# Patient Record
Sex: Male | Born: 1950 | Race: White | Hispanic: No | Marital: Married | Smoking: Never smoker
Health system: Southern US, Community
[De-identification: ages and names within clinical notes are randomized; demographics above are authoritative.]

## PROBLEM LIST (undated history)

## (undated) DIAGNOSIS — T451X5A Adverse effect of antineoplastic and immunosuppressive drugs, initial encounter: Secondary | ICD-10-CM

## (undated) DIAGNOSIS — Z8546 Personal history of malignant neoplasm of prostate: Principal | ICD-10-CM

## (undated) DIAGNOSIS — G620 Drug-induced polyneuropathy: Secondary | ICD-10-CM

## (undated) DIAGNOSIS — Z85528 Personal history of other malignant neoplasm of kidney: Secondary | ICD-10-CM

## (undated) DIAGNOSIS — Z974 Presence of external hearing-aid: Secondary | ICD-10-CM

## (undated) DIAGNOSIS — Z8719 Personal history of other diseases of the digestive system: Secondary | ICD-10-CM

## (undated) DIAGNOSIS — Z860100 Personal history of colon polyps, unspecified: Secondary | ICD-10-CM

## (undated) DIAGNOSIS — E785 Hyperlipidemia, unspecified: Secondary | ICD-10-CM

## (undated) DIAGNOSIS — Z9989 Dependence on other enabling machines and devices: Secondary | ICD-10-CM

## (undated) DIAGNOSIS — T884XXA Failed or difficult intubation, initial encounter: Secondary | ICD-10-CM

## (undated) DIAGNOSIS — R911 Solitary pulmonary nodule: Secondary | ICD-10-CM

## (undated) DIAGNOSIS — Z85038 Personal history of other malignant neoplasm of large intestine: Secondary | ICD-10-CM

## (undated) DIAGNOSIS — G709 Myoneural disorder, unspecified: Secondary | ICD-10-CM

## (undated) DIAGNOSIS — C189 Malignant neoplasm of colon, unspecified: Secondary | ICD-10-CM

## (undated) DIAGNOSIS — J189 Pneumonia, unspecified organism: Secondary | ICD-10-CM

## (undated) DIAGNOSIS — I10 Essential (primary) hypertension: Secondary | ICD-10-CM

## (undated) DIAGNOSIS — N400 Enlarged prostate without lower urinary tract symptoms: Secondary | ICD-10-CM

## (undated) DIAGNOSIS — G4733 Obstructive sleep apnea (adult) (pediatric): Secondary | ICD-10-CM

## (undated) DIAGNOSIS — G4730 Sleep apnea, unspecified: Secondary | ICD-10-CM

## (undated) DIAGNOSIS — H409 Unspecified glaucoma: Secondary | ICD-10-CM

## (undated) DIAGNOSIS — R972 Elevated prostate specific antigen [PSA]: Secondary | ICD-10-CM

## (undated) DIAGNOSIS — Z973 Presence of spectacles and contact lenses: Secondary | ICD-10-CM

## (undated) DIAGNOSIS — R011 Cardiac murmur, unspecified: Secondary | ICD-10-CM

## (undated) DIAGNOSIS — C801 Malignant (primary) neoplasm, unspecified: Secondary | ICD-10-CM

## (undated) DIAGNOSIS — K219 Gastro-esophageal reflux disease without esophagitis: Secondary | ICD-10-CM

## (undated) DIAGNOSIS — C61 Malignant neoplasm of prostate: Secondary | ICD-10-CM

## (undated) DIAGNOSIS — T7840XA Allergy, unspecified, initial encounter: Secondary | ICD-10-CM

## (undated) DIAGNOSIS — D369 Benign neoplasm, unspecified site: Secondary | ICD-10-CM

## (undated) HISTORY — DX: Personal history of other malignant neoplasm of large intestine: Z85.038

## (undated) HISTORY — DX: Personal history of malignant neoplasm of prostate: Z85.46

## (undated) HISTORY — PX: CATARACT EXTRACTION W/ INTRAOCULAR LENS  IMPLANT, BILATERAL: SHX1307

## (undated) HISTORY — DX: Allergy, unspecified, initial encounter: T78.40XA

## (undated) HISTORY — DX: Benign neoplasm, unspecified site: D36.9

## (undated) HISTORY — DX: Malignant neoplasm of colon, unspecified: C18.9

## (undated) HISTORY — PX: LAPAROSCOPIC SIGMOID COLECTOMY: SHX5928

## (undated) HISTORY — DX: Malignant neoplasm of prostate: C61

## (undated) HISTORY — DX: Gastro-esophageal reflux disease without esophagitis: K21.9

## (undated) HISTORY — DX: Personal history of other malignant neoplasm of kidney: Z85.528

## (undated) HISTORY — DX: Unspecified glaucoma: H40.9

## (undated) HISTORY — DX: Sleep apnea, unspecified: G47.30

## (undated) HISTORY — DX: Hyperlipidemia, unspecified: E78.5

---

## 1977-01-16 HISTORY — PX: INGUINAL HERNIA REPAIR: SHX194

## 2007-09-19 HISTORY — PX: KNEE ARTHROSCOPY: SUR90

## 2011-08-22 DIAGNOSIS — G609 Hereditary and idiopathic neuropathy, unspecified: Secondary | ICD-10-CM | POA: Diagnosis not present

## 2011-08-27 DIAGNOSIS — Z09 Encounter for follow-up examination after completed treatment for conditions other than malignant neoplasm: Secondary | ICD-10-CM | POA: Diagnosis not present

## 2011-10-06 DIAGNOSIS — Z85038 Personal history of other malignant neoplasm of large intestine: Secondary | ICD-10-CM | POA: Diagnosis not present

## 2011-10-06 DIAGNOSIS — K5730 Diverticulosis of large intestine without perforation or abscess without bleeding: Secondary | ICD-10-CM | POA: Diagnosis not present

## 2011-10-06 DIAGNOSIS — K219 Gastro-esophageal reflux disease without esophagitis: Secondary | ICD-10-CM | POA: Diagnosis not present

## 2011-10-06 DIAGNOSIS — I10 Essential (primary) hypertension: Secondary | ICD-10-CM | POA: Diagnosis not present

## 2011-10-10 DIAGNOSIS — D235 Other benign neoplasm of skin of trunk: Secondary | ICD-10-CM | POA: Diagnosis not present

## 2011-10-13 DIAGNOSIS — D131 Benign neoplasm of stomach: Secondary | ICD-10-CM | POA: Diagnosis not present

## 2011-10-13 DIAGNOSIS — K219 Gastro-esophageal reflux disease without esophagitis: Secondary | ICD-10-CM | POA: Diagnosis not present

## 2011-10-13 DIAGNOSIS — K2940 Chronic atrophic gastritis without bleeding: Secondary | ICD-10-CM | POA: Diagnosis not present

## 2011-10-13 DIAGNOSIS — Z5181 Encounter for therapeutic drug level monitoring: Secondary | ICD-10-CM | POA: Diagnosis not present

## 2011-10-13 DIAGNOSIS — K2960 Other gastritis without bleeding: Secondary | ICD-10-CM | POA: Diagnosis not present

## 2011-10-13 DIAGNOSIS — K2270 Barrett's esophagus without dysplasia: Secondary | ICD-10-CM | POA: Diagnosis not present

## 2011-10-13 DIAGNOSIS — K2990 Gastroduodenitis, unspecified, without bleeding: Secondary | ICD-10-CM | POA: Diagnosis not present

## 2011-10-13 DIAGNOSIS — K449 Diaphragmatic hernia without obstruction or gangrene: Secondary | ICD-10-CM | POA: Diagnosis not present

## 2011-10-13 DIAGNOSIS — K2100 Gastro-esophageal reflux disease with esophagitis, without bleeding: Secondary | ICD-10-CM | POA: Diagnosis not present

## 2011-10-13 DIAGNOSIS — K2970 Gastritis, unspecified, without bleeding: Secondary | ICD-10-CM | POA: Diagnosis not present

## 2011-10-13 DIAGNOSIS — K319 Disease of stomach and duodenum, unspecified: Secondary | ICD-10-CM | POA: Diagnosis not present

## 2011-10-13 HISTORY — PX: ESOPHAGOGASTRODUODENOSCOPY: SHX1529

## 2011-11-05 DIAGNOSIS — H269 Unspecified cataract: Secondary | ICD-10-CM | POA: Diagnosis not present

## 2011-11-05 DIAGNOSIS — R634 Abnormal weight loss: Secondary | ICD-10-CM | POA: Diagnosis not present

## 2011-11-05 DIAGNOSIS — C187 Malignant neoplasm of sigmoid colon: Secondary | ICD-10-CM | POA: Diagnosis not present

## 2011-11-05 DIAGNOSIS — N400 Enlarged prostate without lower urinary tract symptoms: Secondary | ICD-10-CM | POA: Diagnosis not present

## 2011-11-05 DIAGNOSIS — C649 Malignant neoplasm of unspecified kidney, except renal pelvis: Secondary | ICD-10-CM | POA: Diagnosis not present

## 2011-11-05 DIAGNOSIS — G4733 Obstructive sleep apnea (adult) (pediatric): Secondary | ICD-10-CM | POA: Diagnosis not present

## 2011-11-12 DIAGNOSIS — C649 Malignant neoplasm of unspecified kidney, except renal pelvis: Secondary | ICD-10-CM | POA: Diagnosis not present

## 2011-11-12 DIAGNOSIS — D400 Neoplasm of uncertain behavior of prostate: Secondary | ICD-10-CM | POA: Diagnosis not present

## 2011-11-24 DIAGNOSIS — K2990 Gastroduodenitis, unspecified, without bleeding: Secondary | ICD-10-CM | POA: Diagnosis not present

## 2011-11-24 DIAGNOSIS — D131 Benign neoplasm of stomach: Secondary | ICD-10-CM | POA: Diagnosis not present

## 2011-11-24 DIAGNOSIS — K2970 Gastritis, unspecified, without bleeding: Secondary | ICD-10-CM | POA: Diagnosis not present

## 2011-11-24 DIAGNOSIS — K449 Diaphragmatic hernia without obstruction or gangrene: Secondary | ICD-10-CM | POA: Diagnosis not present

## 2011-11-24 DIAGNOSIS — K2100 Gastro-esophageal reflux disease with esophagitis, without bleeding: Secondary | ICD-10-CM | POA: Diagnosis not present

## 2011-11-25 DIAGNOSIS — H2510 Age-related nuclear cataract, unspecified eye: Secondary | ICD-10-CM | POA: Diagnosis not present

## 2011-11-28 DIAGNOSIS — N401 Enlarged prostate with lower urinary tract symptoms: Secondary | ICD-10-CM | POA: Diagnosis not present

## 2011-11-28 DIAGNOSIS — N138 Other obstructive and reflux uropathy: Secondary | ICD-10-CM | POA: Diagnosis not present

## 2011-11-28 DIAGNOSIS — C649 Malignant neoplasm of unspecified kidney, except renal pelvis: Secondary | ICD-10-CM | POA: Diagnosis not present

## 2011-11-28 DIAGNOSIS — R972 Elevated prostate specific antigen [PSA]: Secondary | ICD-10-CM | POA: Diagnosis not present

## 2011-12-16 DIAGNOSIS — IMO0002 Reserved for concepts with insufficient information to code with codable children: Secondary | ICD-10-CM | POA: Diagnosis not present

## 2011-12-16 DIAGNOSIS — H18509 Unspecified hereditary corneal dystrophies, unspecified eye: Secondary | ICD-10-CM | POA: Diagnosis not present

## 2011-12-17 DIAGNOSIS — R972 Elevated prostate specific antigen [PSA]: Secondary | ICD-10-CM | POA: Diagnosis not present

## 2011-12-17 DIAGNOSIS — N138 Other obstructive and reflux uropathy: Secondary | ICD-10-CM | POA: Diagnosis not present

## 2011-12-17 DIAGNOSIS — D400 Neoplasm of uncertain behavior of prostate: Secondary | ICD-10-CM | POA: Diagnosis not present

## 2011-12-17 DIAGNOSIS — N401 Enlarged prostate with lower urinary tract symptoms: Secondary | ICD-10-CM | POA: Diagnosis not present

## 2011-12-17 DIAGNOSIS — C649 Malignant neoplasm of unspecified kidney, except renal pelvis: Secondary | ICD-10-CM | POA: Diagnosis not present

## 2011-12-23 DIAGNOSIS — I10 Essential (primary) hypertension: Secondary | ICD-10-CM | POA: Diagnosis not present

## 2011-12-23 DIAGNOSIS — H269 Unspecified cataract: Secondary | ICD-10-CM | POA: Diagnosis not present

## 2011-12-23 DIAGNOSIS — G4733 Obstructive sleep apnea (adult) (pediatric): Secondary | ICD-10-CM | POA: Diagnosis not present

## 2011-12-23 DIAGNOSIS — Z01818 Encounter for other preprocedural examination: Secondary | ICD-10-CM | POA: Diagnosis not present

## 2011-12-23 DIAGNOSIS — E7800 Pure hypercholesterolemia, unspecified: Secondary | ICD-10-CM | POA: Diagnosis not present

## 2011-12-23 DIAGNOSIS — R972 Elevated prostate specific antigen [PSA]: Secondary | ICD-10-CM | POA: Diagnosis not present

## 2011-12-25 DIAGNOSIS — G4733 Obstructive sleep apnea (adult) (pediatric): Secondary | ICD-10-CM | POA: Diagnosis not present

## 2011-12-25 DIAGNOSIS — I10 Essential (primary) hypertension: Secondary | ICD-10-CM | POA: Diagnosis not present

## 2011-12-25 DIAGNOSIS — C19 Malignant neoplasm of rectosigmoid junction: Secondary | ICD-10-CM | POA: Diagnosis not present

## 2011-12-25 DIAGNOSIS — E7800 Pure hypercholesterolemia, unspecified: Secondary | ICD-10-CM | POA: Diagnosis not present

## 2011-12-30 DIAGNOSIS — D400 Neoplasm of uncertain behavior of prostate: Secondary | ICD-10-CM | POA: Diagnosis not present

## 2011-12-30 DIAGNOSIS — C649 Malignant neoplasm of unspecified kidney, except renal pelvis: Secondary | ICD-10-CM | POA: Diagnosis not present

## 2011-12-30 DIAGNOSIS — N138 Other obstructive and reflux uropathy: Secondary | ICD-10-CM | POA: Diagnosis not present

## 2011-12-30 DIAGNOSIS — N401 Enlarged prostate with lower urinary tract symptoms: Secondary | ICD-10-CM | POA: Diagnosis not present

## 2011-12-30 DIAGNOSIS — R972 Elevated prostate specific antigen [PSA]: Secondary | ICD-10-CM | POA: Diagnosis not present

## 2012-01-13 DIAGNOSIS — IMO0002 Reserved for concepts with insufficient information to code with codable children: Secondary | ICD-10-CM | POA: Diagnosis not present

## 2012-01-13 DIAGNOSIS — H259 Unspecified age-related cataract: Secondary | ICD-10-CM | POA: Diagnosis not present

## 2012-01-13 DIAGNOSIS — H2510 Age-related nuclear cataract, unspecified eye: Secondary | ICD-10-CM | POA: Diagnosis not present

## 2012-02-02 DIAGNOSIS — E7800 Pure hypercholesterolemia, unspecified: Secondary | ICD-10-CM | POA: Diagnosis not present

## 2012-02-02 DIAGNOSIS — I10 Essential (primary) hypertension: Secondary | ICD-10-CM | POA: Diagnosis not present

## 2012-03-10 DIAGNOSIS — H269 Unspecified cataract: Secondary | ICD-10-CM | POA: Diagnosis not present

## 2012-03-10 DIAGNOSIS — G4733 Obstructive sleep apnea (adult) (pediatric): Secondary | ICD-10-CM | POA: Diagnosis not present

## 2012-03-10 DIAGNOSIS — G9009 Other idiopathic peripheral autonomic neuropathy: Secondary | ICD-10-CM | POA: Diagnosis not present

## 2012-03-10 DIAGNOSIS — C649 Malignant neoplasm of unspecified kidney, except renal pelvis: Secondary | ICD-10-CM | POA: Diagnosis not present

## 2012-03-21 HISTORY — PX: ROBOTIC ASSITED PARTIAL NEPHRECTOMY: SHX6087

## 2012-03-22 DIAGNOSIS — G609 Hereditary and idiopathic neuropathy, unspecified: Secondary | ICD-10-CM | POA: Diagnosis not present

## 2012-03-22 DIAGNOSIS — G5600 Carpal tunnel syndrome, unspecified upper limb: Secondary | ICD-10-CM | POA: Diagnosis not present

## 2012-04-01 DIAGNOSIS — L570 Actinic keratosis: Secondary | ICD-10-CM | POA: Diagnosis not present

## 2012-04-01 DIAGNOSIS — D235 Other benign neoplasm of skin of trunk: Secondary | ICD-10-CM | POA: Diagnosis not present

## 2012-05-18 DIAGNOSIS — Z23 Encounter for immunization: Secondary | ICD-10-CM | POA: Diagnosis not present

## 2012-05-18 DIAGNOSIS — E7800 Pure hypercholesterolemia, unspecified: Secondary | ICD-10-CM | POA: Diagnosis not present

## 2012-05-18 DIAGNOSIS — I10 Essential (primary) hypertension: Secondary | ICD-10-CM | POA: Diagnosis not present

## 2012-05-18 DIAGNOSIS — K219 Gastro-esophageal reflux disease without esophagitis: Secondary | ICD-10-CM | POA: Diagnosis not present

## 2012-05-18 DIAGNOSIS — C189 Malignant neoplasm of colon, unspecified: Secondary | ICD-10-CM | POA: Diagnosis not present

## 2012-05-19 DIAGNOSIS — N400 Enlarged prostate without lower urinary tract symptoms: Secondary | ICD-10-CM | POA: Diagnosis not present

## 2012-05-19 DIAGNOSIS — I10 Essential (primary) hypertension: Secondary | ICD-10-CM | POA: Diagnosis not present

## 2012-05-19 DIAGNOSIS — C189 Malignant neoplasm of colon, unspecified: Secondary | ICD-10-CM | POA: Diagnosis not present

## 2012-05-19 DIAGNOSIS — E7800 Pure hypercholesterolemia, unspecified: Secondary | ICD-10-CM | POA: Diagnosis not present

## 2012-05-25 DIAGNOSIS — Z91041 Radiographic dye allergy status: Secondary | ICD-10-CM | POA: Diagnosis not present

## 2012-05-25 DIAGNOSIS — C649 Malignant neoplasm of unspecified kidney, except renal pelvis: Secondary | ICD-10-CM | POA: Diagnosis not present

## 2012-05-25 DIAGNOSIS — C187 Malignant neoplasm of sigmoid colon: Secondary | ICD-10-CM | POA: Diagnosis not present

## 2012-05-27 ENCOUNTER — Encounter: Payer: Self-pay | Admitting: Gastroenterology

## 2012-05-31 ENCOUNTER — Other Ambulatory Visit: Payer: Self-pay | Admitting: Oncology

## 2012-05-31 ENCOUNTER — Encounter: Payer: Self-pay | Admitting: Oncology

## 2012-05-31 DIAGNOSIS — Z85038 Personal history of other malignant neoplasm of large intestine: Secondary | ICD-10-CM | POA: Insufficient documentation

## 2012-05-31 DIAGNOSIS — Z85528 Personal history of other malignant neoplasm of kidney: Secondary | ICD-10-CM

## 2012-05-31 DIAGNOSIS — C189 Malignant neoplasm of colon, unspecified: Secondary | ICD-10-CM

## 2012-05-31 DIAGNOSIS — C649 Malignant neoplasm of unspecified kidney, except renal pelvis: Secondary | ICD-10-CM

## 2012-05-31 HISTORY — DX: Personal history of other malignant neoplasm of kidney: Z85.528

## 2012-05-31 HISTORY — DX: Personal history of other malignant neoplasm of large intestine: Z85.038

## 2012-06-02 DIAGNOSIS — I10 Essential (primary) hypertension: Secondary | ICD-10-CM | POA: Diagnosis not present

## 2012-06-02 DIAGNOSIS — K219 Gastro-esophageal reflux disease without esophagitis: Secondary | ICD-10-CM | POA: Diagnosis not present

## 2012-06-02 DIAGNOSIS — G4733 Obstructive sleep apnea (adult) (pediatric): Secondary | ICD-10-CM | POA: Diagnosis not present

## 2012-06-09 ENCOUNTER — Encounter: Payer: Self-pay | Admitting: Oncology

## 2012-06-09 ENCOUNTER — Ambulatory Visit: Payer: Medicare Other

## 2012-06-09 ENCOUNTER — Other Ambulatory Visit: Payer: Medicare Other | Admitting: Lab

## 2012-06-09 ENCOUNTER — Ambulatory Visit (HOSPITAL_BASED_OUTPATIENT_CLINIC_OR_DEPARTMENT_OTHER): Payer: Medicare Other | Admitting: Oncology

## 2012-06-09 ENCOUNTER — Telehealth: Payer: Self-pay | Admitting: Oncology

## 2012-06-09 VITALS — BP 131/94 | HR 98 | Temp 97.9°F | Resp 20 | Ht 72.0 in | Wt 253.8 lb

## 2012-06-09 DIAGNOSIS — C187 Malignant neoplasm of sigmoid colon: Secondary | ICD-10-CM | POA: Diagnosis not present

## 2012-06-09 DIAGNOSIS — G4733 Obstructive sleep apnea (adult) (pediatric): Secondary | ICD-10-CM | POA: Insufficient documentation

## 2012-06-09 DIAGNOSIS — H409 Unspecified glaucoma: Secondary | ICD-10-CM

## 2012-06-09 DIAGNOSIS — K317 Polyp of stomach and duodenum: Secondary | ICD-10-CM

## 2012-06-09 DIAGNOSIS — C649 Malignant neoplasm of unspecified kidney, except renal pelvis: Secondary | ICD-10-CM | POA: Diagnosis not present

## 2012-06-09 DIAGNOSIS — G620 Drug-induced polyneuropathy: Secondary | ICD-10-CM | POA: Insufficient documentation

## 2012-06-09 DIAGNOSIS — N400 Enlarged prostate without lower urinary tract symptoms: Secondary | ICD-10-CM | POA: Insufficient documentation

## 2012-06-09 DIAGNOSIS — C189 Malignant neoplasm of colon, unspecified: Secondary | ICD-10-CM

## 2012-06-09 DIAGNOSIS — K219 Gastro-esophageal reflux disease without esophagitis: Secondary | ICD-10-CM

## 2012-06-09 DIAGNOSIS — I10 Essential (primary) hypertension: Secondary | ICD-10-CM

## 2012-06-09 DIAGNOSIS — K635 Polyp of colon: Secondary | ICD-10-CM

## 2012-06-09 HISTORY — DX: Unspecified glaucoma: H40.9

## 2012-06-09 NOTE — Telephone Encounter (Signed)
gv and printed appt schedule for Oct Nov and April

## 2012-06-09 NOTE — Progress Notes (Signed)
New Patient Hematology-Oncology Evaluation   Scott Hayden [id number] Mar 10, 1951 61 y.o. 06/09/2012  CC: Dr Cindra Presume, [facility]          [address]  [address] [place], [place] [address]         Dr. 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 He had a history of benign colon polyps and was also found to have gastric polyps. His mother died of gastric cancer at aged 70. He was on an every three-month surveillance program with colonoscopies. He had noted a minimal change in bowel habits with loose stools but no hematochezia or melena. He was not told he was anemic. A routine survey colonoscopy done in July 2010 revealed a mass in the sigmoid colon. He underwent surgical resection on 03/09/2009 at [facility] in [place]. Procedure is a laparoscopic hand-assisted sigmoid colon resection with a low stapled colo-proctostomy by Dr. Dory Horn. Pathology showed a 2 x 2.5 cm moderately differentiated adenocarcinoma, no vascular or lymphatic or perineural invasion, 2 of 13 lymph nodes positive. Tumor extended into the muscular wall. A pathologic T2 N1. Tumor arose in a tubulovillous adenoma. I do not find a preop CEA. I do not see results for K-RAS testing, or microsatellite instability/instability testing.  A staging evaluation including CT scan chest abdomen and pelvis incidentally showed a 2 cm enhancing mass within the left kidney.  It was elected to treat the colon cancer first. He received 12 cycles of FOLFOX over 6 months. Chemotherapy was initiated on 05/15/2009. 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 This forced him to go on disability. He could not button buttons, he had  significant difficulty ambulating.  After sufficient recovery time from chemotherapy, he went on to have a robotic partial left nephrectomy on 03/21/2010. Pathology showed a 2 cm, clear cell, Fuhrman grade 2 lesion. No vascular invasion. Lesion limited to the kidney. No sarcomatoid features. No necrosis. Grade 2. Negative margins. Surgeon Dr. Joycelyn Man  He has been followed with CT scans ultrasounds and labs since that time and has had no signs for recurrent disease. Last CAT scan done 11/05/2011. Last endoscopy done 10/13/2011. Findings were small hiatus hernia, multiple gastric polyps, small area of Barrett's esophagus.  He still has significant residual neuropathy. He is taking Neurontin without much relief. Trials of B12 have not helped.  He denies any abdominal pain or cramping. No hematochezia or melena. No dysuria or frequency. He is known to have an enlarged prostate. He has had a number of biopsies in the past which have been benign.   PMH: Past Medical History  Diagnosis Date  . Colon cancer 05/31/2012  . Cancer of kidney 05/31/2012  . 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 Sleep apnea, obstructive 06/09/2012    Never smoker  . HTN (hypertension), benign 06/09/2012  . GERD (gastroesophageal reflux disease) 06/09/2012  . Glaucoma 06/09/2012  . Gastric polyps 06/09/2012  . Colon polyps 06/09/2012  No history of hepatitis, yellow jaundice, mononucleosis, seizure, stroke, diabetes, emphysema, ulcers. No thyroid disease.   past surgical history: In addition to above, steroid injection left knee for meniscus problem. Left inguinal hernia repair June 1978. Bilateral cataract  extraction with intraocular lens implants  Allergies: Allergies  Allergen Reactions  . Shellfish Allergy Swelling    Eye puffiness  . Ultravist (Iopromide) Rash    Rash on chest    Medications: Aspirin 81 mg daily, B complex vitamins 1 daily, calcium  with vitamin D supplements one daily, Nexium 40 mg daily, TriCor 145 mg daily, Neurontin 300 mg twice a day, HCTZ 12.5 mg daily, Xalatan eyedrops 0.05% ophthalmic solution one drop both eyes at bedtime, Toprol-XL 100 mg every 24 hours, multivitamins with minerals one daily.   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 Nonsmoker. Rare alcohol. Married. Wife is a Runner, broadcasting/film/video. 2 daughters age 25 and 48 both healthy. He relocated here from [place]. One daughter lives in [place]. She has 2 children.   Family History: His father had asbestosis and died at age 80 also was treated for Hodgkin's lymphoma. Mother had cardiac disease had an MI. Gastric cancer died age 22;  he has one sister who is alive and healthy who is 3 years older than him   Review of Systems: Constitutional symptoms: No constitutional symptoms HEENT: No sore throat Respiratory: No cough or dyspnea Cardiovascular:  No chest pain or palpitations Gastrointestinal ROS: No abdominal pain, no change in bowel habit, no hematochezia or melena Genito-Urinary ROS: No dysuria or frequency, no nocturia Hematological and Lymphatic: Musculoskeletal: No muscle or bone pain Neurologic: Persistent distal neuropathy hands and feet, Dermatologic: No rash Remaining ROS negative.  Physical Exam: Blood pressure 131/94, pulse 98, temperature 97.9 F (36.6 C), temperature source Oral, resp. rate 20, height 6' (1.829 m), weight 253 lb 12.8 oz (115.123 kg). Wt Readings from Last 3 Encounters:  06/09/12 253 lb 12.8 oz (115.123 kg)    General appearance: Well-nourished Caucasian man Head: Normal  Neck: Normal Lymph nodes: No adenopathy Breasts: Lungs: Clear to auscultation resonant to percussion Heart: Regular rhythm no murmur Abdominal: Soft, nontender, no mass, no organomegaly GU: Not examined Extremities: No edema no calf  tenderness Neurologic: Mental status intact, PERRLA, optic disc sharp, vessels normal, motor strength 5 over 5, reflexes 1+ symmetric, moderate to severe decrease in vibration sensation over the fingertips and feet by tuning fork exam, sensation intact to pinprick upper and lower extremities Skin: Loss of hair at the ankles    Lab Results: No results found for this basename: WBC, HGB, HCT, MCV, PLT     Chemistry   No results found for this basename: NA, K, CL, CO2, BUN, CREATININE, GLU   No results found for this basename: CALCIUM, ALKPHOS, AST, ALT, BILITOT        Impression and Plan: #1. T2 N1 2 node positive moderately differentiated adenocarcinoma sigmoid colon treated as outlined above. He remains in a clinical remission now out over 3 years from diagnosis in July 2010. Plan: I will get a baseline CT scan abdomen pelvis and a chest x-ray at this time. Since 3 year disease-free survival predicts 5 year disease-free survival, he is reassured that he is doing well. Subsequent to the CAT scan that we will do next week, I will follow him with ultrasounds of the liver at 6 month intervals for the next 2 years.  #2. Stage I grade 2 cancer of the left kidney treated with robotic surgery/partial nephrectomy  #3. Chemotherapy related peripheral neuropathy He does not feel he is getting much relief from the Neurontin. I suggested he consider a trial of  lyrica.  #4. Polyposis coli Not clear if he was ever tested for hereditary polyposis syndromes. He brought extensive records which I will review. She has not been tested I think he should be with respect to personal and family counseling.  #4. Gastric polyps He is due to have his initial evaluation with one of our gastroenterologists, Dr. Wendall Papa, as soon and he can outline a surveillance program for the patient.  #5. GERD  #6. Glaucoma  #7. Degenerative arthritis left knee  #8. Obstructive sleep apnea on CPAP  #9. Benign  prostatic hyperplasia  #10. Essential hypertension      Levert Feinstein, MD 06/09/2012, 1:10 PM

## 2012-06-09 NOTE — Progress Notes (Signed)
Checked in new pt with no financial concerns. °

## 2012-06-09 NOTE — Patient Instructions (Signed)
CT scan within next week - we will schedule - take pre-medication as prescribed We will have you back in 6 months and do lab and an abdominal ultrasound 1 week before MD visit Call for any interim problems

## 2012-06-16 ENCOUNTER — Ambulatory Visit (HOSPITAL_COMMUNITY)
Admission: RE | Admit: 2012-06-16 | Discharge: 2012-06-16 | Disposition: A | Discharge status: Home / Self Care | Payer: Medicare Other | Source: Ambulatory Visit | Attending: Oncology | Admitting: Oncology

## 2012-06-16 DIAGNOSIS — C189 Malignant neoplasm of colon, unspecified: Secondary | ICD-10-CM

## 2012-06-16 DIAGNOSIS — K7689 Other specified diseases of liver: Secondary | ICD-10-CM | POA: Insufficient documentation

## 2012-06-16 DIAGNOSIS — C649 Malignant neoplasm of unspecified kidney, except renal pelvis: Secondary | ICD-10-CM | POA: Insufficient documentation

## 2012-06-16 DIAGNOSIS — R911 Solitary pulmonary nodule: Secondary | ICD-10-CM | POA: Insufficient documentation

## 2012-06-16 DIAGNOSIS — R918 Other nonspecific abnormal finding of lung field: Secondary | ICD-10-CM | POA: Diagnosis not present

## 2012-06-16 MED ORDER — IOHEXOL 300 MG/ML  SOLN
100.0000 mL | Freq: Once | INTRAMUSCULAR | Status: AC | PRN
  Administered 2012-06-16: 100 mL via INTRAVENOUS

## 2012-06-18 ENCOUNTER — Telehealth: Payer: Self-pay | Admitting: *Deleted

## 2012-06-18 NOTE — Telephone Encounter (Signed)
Message copied by Sabino Snipes on Fri Jun 18, 2012  4:12 PM ------      Message from: Levert Feinstein      Created: Wed Jun 16, 2012  4:11 PM       Call pt CT abdomen and CXR negative for cancer recurrence

## 2012-06-18 NOTE — Telephone Encounter (Signed)
Pt informed of negative results of CT & chest & pt very appreciative.

## 2012-06-21 ENCOUNTER — Telehealth: Payer: Self-pay | Admitting: *Deleted

## 2012-06-21 ENCOUNTER — Ambulatory Visit (AMBULATORY_SURGERY_CENTER): Payer: Medicare Other | Admitting: *Deleted

## 2012-06-21 VITALS — Ht 72.0 in | Wt 253.0 lb

## 2012-06-21 DIAGNOSIS — Z1211 Encounter for screening for malignant neoplasm of colon: Secondary | ICD-10-CM

## 2012-06-21 DIAGNOSIS — Z85038 Personal history of other malignant neoplasm of large intestine: Secondary | ICD-10-CM

## 2012-06-21 MED ORDER — MOVIPREP 100 G PO SOLR: ORAL | Status: DC

## 2012-06-21 NOTE — Telephone Encounter (Signed)
Medical Release obtained for colon records and EGD records from [place].  Pt. Had surgery for colon cancer in 2010.  Release given to Patty Lewis along with records pt. Brought with him to previsit.  His colon is scheduled for 07/02/12 with Dr. Jacobs.  JoAnn Peachie Barkalow  

## 2012-06-21 NOTE — Progress Notes (Signed)
Medical Release obtained for colon records and EGD records from [place].  Pt. Had surgery for colon cancer in 2010.  Release given to Patty Lewis along with records pt. Brought with him to previsit.  His colon is scheduled for 07/02/12 with Dr. Jacobs.  JoAnn Maciah Schweigert  

## 2012-06-23 NOTE — Telephone Encounter (Signed)
Release faxed

## 2012-06-24 ENCOUNTER — Telehealth: Payer: Self-pay | Admitting: Gastroenterology

## 2012-06-24 NOTE — Telephone Encounter (Signed)
Colonoscopy in 2010 found sigmoid cancer for which he underwent surgery  Patient notes state: "colonoscopy 02/2010, Dr. Arley Phenix at [place], [place] ([place]  And "colonoscopy 02/2011 Dr. Arley Phenix at same found diverticulosis of colon, internal hemorrhoids"  It does not appear that he needs repeat colonoscopy now.  Usual interval is 1 year after colon cancer surgery, then 3 years later, then every 5 years.    Please cancel his upcoming colonoscopy on 11/15 and schedule him for new gi office visit with me.  Will need the actual colonoscopy reports from 2010, 2011, 2012 Dr. Arley Phenix so that I can review and advise the patient on proper timing for next colonoscopy

## 2012-06-28 ENCOUNTER — Encounter: Payer: Self-pay | Admitting: Oncology

## 2012-06-28 NOTE — Telephone Encounter (Signed)
Pt has appt with Dr Christella Hartigan to discuss

## 2012-07-02 ENCOUNTER — Other Ambulatory Visit: Payer: Medicare Other | Admitting: Gastroenterology

## 2012-07-23 ENCOUNTER — Ambulatory Visit (INDEPENDENT_AMBULATORY_CARE_PROVIDER_SITE_OTHER): Payer: Medicare Other | Admitting: Gastroenterology

## 2012-07-23 ENCOUNTER — Encounter: Payer: Self-pay | Admitting: Gastroenterology

## 2012-07-23 VITALS — BP 120/80 | HR 72 | Ht 71.5 in | Wt 256.0 lb

## 2012-07-23 DIAGNOSIS — C189 Malignant neoplasm of colon, unspecified: Secondary | ICD-10-CM

## 2012-07-23 NOTE — Progress Notes (Signed)
HPI: This is a     very pleasant 61 year old man who recently moved from [place].   Colonoscopy in 2010 found sigmoid cancer for which he underwent surgery.  No colostomy.  Adjuvant chemo.     Patient notes state: "colonoscopy 02/2010, Dr. Arley Phenix at [place], [place] ([place])" And "colonoscopy 02/2011 Dr. Arley Phenix at same found diverticulosis of colon, internal hemorrhoids"  Has had upper endoscopies and colonoscopies. Was told he needed these "every single year by [facility] doctors."  He had at least one (perhaps two) colonoscopies at the time of his colon cancer diagnosis in 2010 ("was on a 3 year cycle").  He has also been told that he had Barrett's esophagus and would need looks in his esophagus every year or 18 months.    Review of systems: Pertinent positive and negative review of systems were noted in the above HPI section. Complete review of systems was performed and was otherwise normal.    Past Medical History  Diagnosis Date  . Colon cancer 05/31/2012  . Cancer of kidney 05/31/2012  . 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 Sleep apnea, obstructive 06/09/2012    Never smoker  . HTN (hypertension), benign 06/09/2012  . GERD (gastroesophageal reflux disease) 06/09/2012  . Glaucoma 06/09/2012  . Gastric polyps 06/09/2012  . Colon polyps 06/09/2012  . Blood transfusion without reported diagnosis 2010  . Hyperlipidemia     Past Surgical History  Procedure Date  . Cataract extraction w/ intraocular lens  implant, bilateral 04/2011 & 12/2011  . Colon surgery 02/2009     Laparoscopic Sigmoid colon resection  . Partial nephrectomy 03/21/12    robotic - on left kidney - cancer  . Knee arthroscopy 09/2007    left  . Inguinal hernia repair 01/1977    left    Current Outpatient Prescriptions  Medication Sig Dispense Refill  . aspirin 81 MG tablet Take 81 mg by mouth every other day.      . b complex vitamins tablet Take 1 tablet by  mouth daily. 100mg       . Calcium Carbonate-Vitamin D (CALCIUM 600+D) 600-400 MG-UNIT per tablet Take 1 tablet by mouth daily.      Marland Kitchen esomeprazole (NEXIUM) 40 MG capsule Take 40 mg by mouth daily.      . fenofibrate (TRICOR) 145 MG tablet Take 145 mg by mouth daily.      Marland Kitchen gabapentin (NEURONTIN) 300 MG capsule Take 300 mg by mouth 2 (two) times daily.      . hydrochlorothiazide (MICROZIDE) 12.5 MG capsule Take 12.5 mg by mouth daily.      Marland Kitchen latanoprost (XALATAN) 0.005 % ophthalmic solution Place into both eyes at bedtime.      . metoprolol succinate (TOPROL-XL) 100 MG 24 hr tablet Take 100 mg by mouth daily. Take with or immediately following a meal.      . Multiple Vitamins-Minerals (MULTIVITAMIN WITH MINERALS) tablet Take 1 tablet by mouth daily.      Marland Kitchen MOVIPREP 100 G SOLR MOVI PREP take as directed no substitution  1 kit  0    Allergies as of 07/23/2012 - Review Complete 07/23/2012  Allergen Reaction Noted  . Shellfish allergy Swelling 06/09/2012  . Contrast media (iodinated diagnostic agents) Rash 06/16/2012  . Ultravist (iopromide) Rash 06/09/2012    Family History  Problem Relation Age of Onset  . Stomach cancer Mother     History   Social History  . Marital Status:  Married    Spouse Name: N/A    Number of Children: N/A  . Years of Education: N/A   Occupational History  . Not on file.   Social History Main Topics  . Smoking status: Never Smoker   . Smokeless tobacco: Never Used  . Alcohol Use: 0.0 oz/week     Comment: rarely wine or beer  . Drug Use: No  . Sexually Active: Not on file   Other Topics Concern  . Not on file   Social History Narrative  . No narrative on file       Physical Exam: BP 120/80  Pulse 72  Ht 5' 11.5" (1.816 m)  Wt 256 lb (116.121 kg)  BMI 35.21 kg/m2 Constitutional: generally well-appearing Psychiatric: alert and oriented x3 Eyes: extraocular movements intact Mouth: oral pharynx moist, no lesions Neck: supple no  lymphadenopathy Cardiovascular: heart regular rate and rhythm Lungs: clear to auscultation bilaterally Abdomen: soft, nontender, nondistended, no obvious ascites, no peritoneal signs, normal bowel sounds Extremities: no lower extremity edema bilaterally Skin: no lesions on visible extremities    Assessment and plan: 61 y.o. male with  personal history of colon cancer, possible history of Barrett's esophagus  We are going to get records from his [place] doctors. This will include all his previous colonoscopy reports, all his previous upper endoscopy reports and all associated path. He was told by one of his [place] physician said he should have a colonoscopy every single year. It is not clear to me why that would be.  Perhaps he has underlying high risk colon, hereditary non-polyposis colon cancer. If that were indeed the case he probably should have had total colectomy at the time of his surgery and so I don't think that is true. He is a very nice man I'm happy to look over his records and give him my opinion on when he needs fnext surveillance.

## 2012-07-23 NOTE — Patient Instructions (Addendum)
We will get your previous records Dr. Arley Phenix [phone]) in [place]. We will need all colonoscopy reports all upper endoscopy reports and all associated pathology reports. Will decide on timing of your next colonoscopy and upper endoscopies. The usual recommended follow up surveillance after colon cancer surgery is 1 year, then 3 year, then 5 years.

## 2012-07-30 ENCOUNTER — Telehealth: Payer: Self-pay | Admitting: Gastroenterology

## 2012-07-30 NOTE — Telephone Encounter (Signed)
Forward  19 pages from [facility] to Dr. Rob Bunting for review on 07-30-12 ym

## 2012-08-03 ENCOUNTER — Telehealth: Payer: Self-pay | Admitting: Gastroenterology

## 2012-08-03 NOTE — Telephone Encounter (Signed)
Colonoscopy July 2010 done for "polyp history" by Dr. Arley Phenix in [place], [place]: Findings sigmoid diverticulosis, internal hemorrhoids, ulcerated polypoid lesion at 20 cm from the anal verge. Biopsies of the 3 cm lesion in sigmoid colon showed adenocarcinoma.  Colonoscopy July 2011, same physician, done for history of colon cancer, findings "all the visualized mucosal surfaces appeared normal except sigmoid diverticulosis, internal hemorrhoids, the anastomosis was normal." Colonoscopy July 2012 by the same physician, done for history of colon cancer, findings "all the visualized mucosal surfaces appeared normal except the following. Sigmoid diverticulosis, the anastomosis was normal at 20 cm, no tumor was seen, positive for internal hemorrhoids.  EGD February 2013 done for GERD, same physician, showed small hiatal hernia, short segment of Barrett's-like changes, multiple polyps in the stomach. Biopsies were negative for H. pylori, they showed no intestinal metaplasia, or polyps were fundic gland polyps.  Patty, Please call him.  I reviewed 2010, 2011, 2012 colonoscopy. 2013 EGD.  HE needs recall colonoscopy July 2015 (three years from his last one).  He does not need repeat EGD, does not have barrett's changes on biopsy.

## 2012-08-04 NOTE — Telephone Encounter (Signed)
Advised patient that per Dr Christella Hartigan (after reviewing previous GI records), He needs a repeat endoscopy July 2015 and no repeat EGD unless future symptoms warrant. Patient questions why he is not to have a repeat EGD in the future. I explained that his pathology showed no intestinal metaplasia, therefore, no routine follow up needed unless he develops new symptoms. Patient verbalizes understanding.

## 2012-08-04 NOTE — Telephone Encounter (Signed)
Left message with male for patient to call back. 

## 2012-10-01 DIAGNOSIS — Z961 Presence of intraocular lens: Secondary | ICD-10-CM | POA: Diagnosis not present

## 2012-10-01 DIAGNOSIS — H5210 Myopia, unspecified eye: Secondary | ICD-10-CM | POA: Diagnosis not present

## 2012-10-01 DIAGNOSIS — H409 Unspecified glaucoma: Secondary | ICD-10-CM | POA: Diagnosis not present

## 2012-10-01 DIAGNOSIS — H4011X0 Primary open-angle glaucoma, stage unspecified: Secondary | ICD-10-CM | POA: Diagnosis not present

## 2012-10-02 ENCOUNTER — Other Ambulatory Visit: Payer: Self-pay

## 2012-10-13 ENCOUNTER — Encounter: Payer: Self-pay | Admitting: Oncology

## 2012-10-13 ENCOUNTER — Telehealth: Payer: Self-pay | Admitting: Oncology

## 2012-10-13 NOTE — Telephone Encounter (Signed)
spoke w/ pt gv appts d/t...td °

## 2012-12-08 ENCOUNTER — Telehealth: Payer: Self-pay | Admitting: *Deleted

## 2012-12-08 ENCOUNTER — Ambulatory Visit (HOSPITAL_COMMUNITY)
Admission: RE | Admit: 2012-12-08 | Discharge: 2012-12-08 | Disposition: A | Discharge status: Home / Self Care | Payer: Medicare Other | Source: Ambulatory Visit | Attending: Oncology | Admitting: Oncology

## 2012-12-08 DIAGNOSIS — I10 Essential (primary) hypertension: Secondary | ICD-10-CM | POA: Diagnosis not present

## 2012-12-08 DIAGNOSIS — C189 Malignant neoplasm of colon, unspecified: Secondary | ICD-10-CM | POA: Diagnosis not present

## 2012-12-08 DIAGNOSIS — C649 Malignant neoplasm of unspecified kidney, except renal pelvis: Secondary | ICD-10-CM | POA: Diagnosis not present

## 2012-12-08 NOTE — Telephone Encounter (Signed)
Message copied by Sabino Snipes on Wed Dec 08, 2012 10:42 AM ------      Message from: Levert Feinstein      Created: Wed Dec 08, 2012  9:40 AM       Call pt abdominmal ultrasound normal ------

## 2012-12-08 NOTE — Telephone Encounter (Signed)
Pt notified that abd. U/S normal per Dr Cyndie Chime.  Pt expressed appreciation.

## 2012-12-14 ENCOUNTER — Ambulatory Visit: Payer: Medicare Other | Admitting: Oncology

## 2012-12-14 ENCOUNTER — Other Ambulatory Visit: Payer: Medicare Other | Admitting: Lab

## 2012-12-24 ENCOUNTER — Ambulatory Visit (HOSPITAL_BASED_OUTPATIENT_CLINIC_OR_DEPARTMENT_OTHER): Payer: Medicare Other | Admitting: Oncology

## 2012-12-24 ENCOUNTER — Telehealth: Payer: Self-pay | Admitting: Oncology

## 2012-12-24 ENCOUNTER — Other Ambulatory Visit (HOSPITAL_BASED_OUTPATIENT_CLINIC_OR_DEPARTMENT_OTHER): Payer: Medicare Other

## 2012-12-24 VITALS — BP 124/73 | HR 74 | Temp 97.8°F | Resp 18 | Ht 71.0 in | Wt 267.0 lb

## 2012-12-24 DIAGNOSIS — C189 Malignant neoplasm of colon, unspecified: Secondary | ICD-10-CM

## 2012-12-24 DIAGNOSIS — C642 Malignant neoplasm of left kidney, except renal pelvis: Secondary | ICD-10-CM

## 2012-12-24 DIAGNOSIS — N400 Enlarged prostate without lower urinary tract symptoms: Secondary | ICD-10-CM | POA: Diagnosis not present

## 2012-12-24 DIAGNOSIS — C187 Malignant neoplasm of sigmoid colon: Secondary | ICD-10-CM

## 2012-12-24 DIAGNOSIS — G620 Drug-induced polyneuropathy: Secondary | ICD-10-CM | POA: Diagnosis not present

## 2012-12-24 DIAGNOSIS — C649 Malignant neoplasm of unspecified kidney, except renal pelvis: Secondary | ICD-10-CM | POA: Diagnosis not present

## 2012-12-24 DIAGNOSIS — K635 Polyp of colon: Secondary | ICD-10-CM

## 2012-12-24 LAB — LACTATE DEHYDROGENASE (CC13): LDH: 175 U/L (ref 125–245)

## 2012-12-24 LAB — CBC WITH DIFFERENTIAL/PLATELET
Basophils Absolute: 0 10*3/uL (ref 0.0–0.1)
EOS%: 2.9 % (ref 0.0–7.0)
HGB: 14.3 g/dL (ref 13.0–17.1)
MCH: 31 pg (ref 27.2–33.4)
NEUT#: 3.9 10*3/uL (ref 1.5–6.5)
RBC: 4.61 10*6/uL (ref 4.20–5.82)
RDW: 13.9 % (ref 11.0–14.6)
lymph#: 2.2 10*3/uL (ref 0.9–3.3)

## 2012-12-24 LAB — COMPREHENSIVE METABOLIC PANEL (CC13)
ALT: 28 U/L (ref 0–55)
Alkaline Phosphatase: 42 U/L (ref 40–150)
BUN: 16.8 mg/dL (ref 7.0–26.0)
Calcium: 9.5 mg/dL (ref 8.4–10.4)
Creatinine: 1.1 mg/dL (ref 0.7–1.3)
Glucose: 119 mg/dl — ABNORMAL HIGH (ref 70–99)
Total Bilirubin: 0.63 mg/dL (ref 0.20–1.20)
Total Protein: 7.7 g/dL (ref 6.4–8.3)

## 2012-12-24 NOTE — Telephone Encounter (Signed)
gv and printed appt sched and avs for pt...pt schedule to see Dr. Berneice Heinrich on June 3rd @ 3:45pm gv referral to [facility]. to send medical records, schedule pt for Genetics on 6.30.14 @ 1pm.,  gv [facility]. referral  form to send over to neurology.... pt aware

## 2012-12-26 NOTE — Progress Notes (Signed)
Hematology and Oncology Follow Up Visit  Scott Hayden [id number] 04/26/1951 62 y.o. 12/26/2012 7:29 PM   Principle Diagnosis: Encounter Diagnoses  Name Primary?  . Colon cancer Yes  . Colon polyps   . Cancer of kidney, left   . BPH (benign prostatic hyperplasia)   . 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 2 x 2.5 cm moderately differentiated adenocarcinoma, 2 of 13 lymph nodes positive, T2 N1 arising in a tubulovillous adenoma. He received 12 cycles of FOLFOX chemotherapy over 6 months. He developed a severe distal neuropathy upper and lower extremities. Part of his initial staging evaluation revealed a 2 cm lesion in his left kidney. When he recovered from chemotherapy for the colon cancer he underwent a right robotic partial left nephrectomy 03/21/2010. Pathology showed a 2 cm clear cell grade 2 lesion with no vascular invasion  He is taking gabapentin for his neuropathy without any significant relief. Neuropathy has not improved over time. He feels like he is wearing boots on his feet.  He has a history of BPH and an elevated PSA. Biopsies in the past were negative for cancer.  He was restaged when he first came to [facility] in October 2013. CT scan of the abdomen and pelvis on 06/16/2012 showed no evidence for new disease.  He has had no interim medical problems.    Medications: reviewed  Allergies:  Allergies  Allergen Reactions  . Shellfish Allergy Swelling    Eye puffiness  . Contrast Media (Iodinated Diagnostic Agents) Rash    Erythema on chest w/ scans in [place], pt did fine with 13hr prep  . Ultravist (Iopromide) Rash    Rash on chest    Review of Systems: Constitutional:   No constitutional symptoms Respiratory: No cough or dyspnea Cardiovascular:  No chest pain  or palpitations Gastrointestinal: No abdominal pain. He still has intermittent loose bowel movements since his colon cancer surgery. No hematochezia or melena Genito-Urinary: Positive nocturia. Musculoskeletal: No muscle bone or joint pain Neurologic: Persistent distal neuropathy upper and lower Skin: No rash or ecchymoses Remaining ROS negative.  Physical Exam: Blood pressure 124/73, pulse 74, temperature 97.8 F (36.6 C), temperature source Oral, resp. rate 18, height 5\' 11"  (1.803 m), weight 267 lb (121.11 kg). Wt Readings from Last 3 Encounters:  12/24/12 267 lb (121.11 kg)  07/23/12 256 lb (116.121 kg)  06/21/12 253 lb (114.76 kg)     General appearance: Well-nourished Caucasian man HENNT: Pharynx no erythema or exudate Lymph nodes: No adenopathy Breasts: Lungs: Clear to auscultation resonant to percussion Heart: Regular rhythm no murmur Abdomen: Soft, nontender, no mass, no organomegaly Extremities: No edema, no calf tenderness Musculoskeletal: GU: Vascular: No cyanosis Neurologic: Motor strength 5 over 5. Moderate to severe decrease in vibration by tuning fork exam over the fingertips Skin: No rash or ecchymosis  Lab Results: Lab Results  Component Value Date   WBC 6.9 12/24/2012   HGB 14.3 12/24/2012   HCT 41.5 12/24/2012   MCV 90.0 12/24/2012   PLT 235 12/24/2012     Chemistry      Component Value Date/Time   NA 141 12/24/2012 1414   K 3.7 12/24/2012 1414   CL 105 12/24/2012 1414   CO2 26 12/24/2012 1414   BUN 16.8 12/24/2012 1414   CREATININE 1.1 12/24/2012  1414      Component Value Date/Time   CALCIUM 9.5 12/24/2012 1414   ALKPHOS 42 12/24/2012 1414   AST 36* 12/24/2012 1414   ALT 28 12/24/2012 1414   BILITOT 0.63 12/24/2012 1414       Radiological Studies: US Abdomen Complete  12/08/2012  *RADIOLOGY REPORT*  Clinical Data:  Colon cancer, renal cell carcinoma, hypertension  ULTRASOUND ABDOMEN:  Technique:  Sonography of upper abdominal structures was performed.  Comparison:   None Correlation:  CT abdomen and pelvis 06/16/2012  Gallbladder:  Normally distended without stones or wall thickening. No pericholecystic fluid or sonographic Murphy sign.  Common bile duct:  Normal caliber 3 mm diameter  Liver:  Echogenic, likely fatty infiltration, though this can be seen with cirrhosis and certain infiltrative disorders.  No focal hepatic mass or nodularity.  Hepatopetal portal venous flow.  IVC:  Normal appearance  Pancreas:  Tail obscured by bowel gas.  Visualized portions of pancreas normal appearance.  Spleen:  Normal appearance, 9.5 cm length  Right kidney:  12.7 cm length. Normal morphology without mass or hydronephrosis.  Left kidney:  11.7 cm length.  Normal morphology without mass or hydronephrosis.  Aorta:  Normal caliber proximally, obscured at mid and distal portions by bowel gas  Other:  No free fluid  IMPRESSION: Incomplete visualization of aorta and pancreas. Probable mild fatty infiltration of liver. No other focal sonographic abnormality identified.   Original Report Authenticated By: Ulyses Southward, M.D.     Impression: #1. Stage III adenocarcinoma sigmoid colon treated as outlined above. No gross evidence for new disease now out almost 4 years. Plan: Continue periodic followup. Repeat ultrasound abdomen and pelvis in 6 months along with clinical visit and lab. He has established himself with Dr. Wendall Papa and will have his next colonoscopy in July of 2015. Likely upper endoscopy at that time as well.  #2. 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  #3. Chemotherapy related peripheral neuropathy. He would like a neurology referral. I suggested a trial of Lyrica. He will wait until he sees the neurologist.  #4. BPH with history of elevated PSA. He would like a urology referral as well.  #5. Questionable history of Barrett's esophagus. See above #1  #6. Polyposis coli He has not had genetic testing. I am  going to refer him to our genetic counselor.  #7. Essential hypertension  #8. History of gastric polyps.  #9. Obstructive sleep apnea on CPAP  #10. Degenerative arthritis left knee   CC:. Dr. Merri Brunette; Dr. Rob Bunting; Dr. Geraldine Contras; Dr. Sebastian Ache   Levert Feinstein, MD 5/11/20147:29 PM

## 2012-12-27 ENCOUNTER — Telehealth: Payer: Self-pay | Admitting: Oncology

## 2012-12-27 NOTE — Telephone Encounter (Signed)
Faxed pt medical records to urology.

## 2012-12-30 DIAGNOSIS — H4011X0 Primary open-angle glaucoma, stage unspecified: Secondary | ICD-10-CM | POA: Diagnosis not present

## 2012-12-30 DIAGNOSIS — H409 Unspecified glaucoma: Secondary | ICD-10-CM | POA: Diagnosis not present

## 2013-01-13 ENCOUNTER — Encounter: Payer: Self-pay | Admitting: Diagnostic Neuroimaging

## 2013-01-13 ENCOUNTER — Ambulatory Visit (INDEPENDENT_AMBULATORY_CARE_PROVIDER_SITE_OTHER): Payer: Medicare Other | Admitting: Diagnostic Neuroimaging

## 2013-01-13 VITALS — BP 119/74 | HR 76 | Temp 98.8°F | Ht 72.0 in | Wt 265.0 lb

## 2013-01-13 DIAGNOSIS — T50904A Poisoning by unspecified drugs, medicaments and biological substances, undetermined, initial encounter: Secondary | ICD-10-CM | POA: Diagnosis not present

## 2013-01-13 DIAGNOSIS — G620 Drug-induced polyneuropathy: Secondary | ICD-10-CM

## 2013-01-13 NOTE — Patient Instructions (Signed)
Check feet on daily basis for infection or trauma.  Consider cymbalta or lyrica in future if pain develops.  Follow up as needed.

## 2013-01-13 NOTE — Progress Notes (Signed)
[facility]  PATIENT: Scott Hayden DOB: June 19, 1951  REFERRING CLINICIAN: [place] HISTORY FROM: patient REASON FOR VISIT: new consult   HISTORICAL  CHIEF COMPLAINT:  Chief Complaint  Patient presents with  . Peripheral Neuropathy    hands, feet    HISTORY OF PRESENT ILLNESS:   62 year old male with history of stage IIIa colon cancer, moderately differentiated adenocarcinoma arising in a tubulovillous adenoma, (T2 N1; 2 of 13 lymph nodes positive) status post sigmoid colectomy, status post 12 cycles of FOLFOX chemo (05/09/09 - 10/22/09), also with left renal cancer (clear-cell grade 2 lesion) status post left nephrectomy, here for evaluation of neuropathy.  Patient was treated in [place], and during last round of chemotherapy developed lack of sensation, loss of sensation in fingers, hands, toes and feet. He never developed significantly painful symptoms. No burning, tingling, pins and needles. He describes the sensation as "wearing gloves and wearing boots". He has noted loss of ability to feel and touch using fingertips. His typing was significantly effected during onset, but over time has learned to type again. Patient also has some balance difficulty, especially with tandem walking.  Patient was evaluated with EMG and nerve conduction study, which showed distal sensory neuropathy with superimposed right carpal tunnel syndrome. He had lab testing for B12, folate, thyroid function, Lyme disease, ESR, ANA, diabetes, SPEP, all of which have been reportedly unremarkable. He was tried on gabapentin 300 mg twice a day for several months without any benefit. Now he is off of gabapentin.  REVIEW OF SYSTEMS: Full 14 system review of systems performed and notable only for numbness weakness.  ALLERGIES: Allergies  Allergen Reactions  . Shellfish Allergy Swelling    Eye puffiness  . Contrast Media (Iodinated Diagnostic Agents) Rash    Erythema on chest w/ scans in [place],  pt did fine with 13hr prep  . Ultravist (Iopromide) Rash    Rash on chest    HOME MEDICATIONS: Outpatient Prescriptions Prior to Visit  Medication Sig Dispense Refill  . aspirin 81 MG tablet Take 81 mg by mouth every other day.      . b complex vitamins tablet Take 1 tablet by mouth daily. 100mg       . Calcium Carbonate-Vitamin D (CALCIUM 600+D) 600-400 MG-UNIT per tablet Take 1 tablet by mouth daily.      Marland Kitchen esomeprazole (NEXIUM) 40 MG capsule Take 40 mg by mouth daily.      . fenofibrate (TRICOR) 145 MG tablet Take 145 mg by mouth daily.      . hydrochlorothiazide (MICROZIDE) 12.5 MG capsule Take 12.5 mg by mouth daily.      Marland Kitchen latanoprost (XALATAN) 0.005 % ophthalmic solution Place into both eyes at bedtime.      . metoprolol succinate (TOPROL-XL) 100 MG 24 hr tablet Take 100 mg by mouth daily. Take with or immediately following a meal.      . Multiple Vitamins-Minerals (MULTIVITAMIN WITH MINERALS) tablet Take 1 tablet by mouth daily.      Marland Kitchen gabapentin (NEURONTIN) 300 MG capsule Take 300 mg by mouth 2 (two) times daily.       No facility-administered medications prior to visit.    PAST MEDICAL HISTORY: Past Medical History  Diagnosis Date  . Colon cancer 05/31/2012  . Cancer of kidney 05/31/2012  . 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 Sleep apnea, obstructive 06/09/2012    Never smoker  . HTN (hypertension), benign 06/09/2012  . GERD (  gastroesophageal reflux disease) 06/09/2012  . Glaucoma 06/09/2012  . Gastric polyps 06/09/2012  . Colon polyps 06/09/2012  . Blood transfusion without reported diagnosis 2010  . Hyperlipidemia     PAST SURGICAL HISTORY: Past Surgical History  Procedure Laterality Date  . Cataract extraction w/ intraocular lens  implant, bilateral  04/2011 & 12/2011  . Colon surgery  02/2009     Laparoscopic Sigmoid colon resection  . Partial nephrectomy  03/21/12    robotic - on left kidney - cancer  .  Knee arthroscopy  09/2007    left  . Inguinal hernia repair  01/1977    left    FAMILY HISTORY: Family History  Problem Relation Age of Onset  . Stomach cancer Mother   . Hodgkin's lymphoma Father     SOCIAL HISTORY:  History   Social History  . Marital Status: Married    Spouse Name: Liborio Nixon    Number of Children: 2  . Years of Education: MS+   Occupational History  . Retired    Social History Main Topics  . Smoking status: Never Smoker   . Smokeless tobacco: Never Used  . Alcohol Use: 0.0 oz/week     Comment: rarely wine or beer  . Drug Use: No  . Sexually Active: Not on file   Other Topics Concern  . Not on file   Social History Narrative   Pt lives at home with spouse.    Caffeine Use: 2-3 cups daily.      PHYSICAL EXAM  Filed Vitals:   01/13/13 1333  BP: 119/74  Pulse: 76  Temp: 98.8 F (37.1 C)  TempSrc: Oral  Height: 6' (1.829 m)  Weight: 265 lb (120.203 kg)    Not recorded    Body mass index is 35.93 kg/(m^2).  GENERAL EXAM: Patient is in no distress  CARDIOVASCULAR: Regular rate and rhythm, no murmurs, no carotid bruits  NEUROLOGIC: MENTAL STATUS: awake, alert, language fluent, comprehension intact, naming intact CRANIAL NERVE: no papilledema on fundoscopic exam, pupils equal and reactive to light, visual fields full to confrontation, extraocular muscles intact, no nystagmus, facial sensation and strength symmetric, uvula midline, shoulder shrug symmetric, tongue midline. MOTOR: normal bulk and tone, full strength in the BUE, BLE SENSORY: HYPERSENS TO PP IN FINGERTIPS AND TOES. ABSENT VIB AT TOES. DECR LT AND TEMP IN TOES AND FINGERS IN GRADIENT PROXIMALLY. COORDINATION: finger-nose-finger, fine finger movements normal REFLEXES: BUE 2, KNEES 2, ANKLES 0. MUTE TOES. GAIT/STATION: narrow based gait; able to walk on toes, heels; DIFFICULTY WITH TANDEM; romberg is negative   DIAGNOSTIC DATA (LABS, IMAGING, TESTING) - I reviewed patient  records, labs, notes, testing and imaging myself where available.  Lab Results  Component Value Date   WBC 6.9 12/24/2012   HGB 14.3 12/24/2012   HCT 41.5 12/24/2012   MCV 90.0 12/24/2012   PLT 235 12/24/2012      Component Value Date/Time   NA 141 12/24/2012 1414   K 3.7 12/24/2012 1414   CL 105 12/24/2012 1414   CO2 26 12/24/2012 1414   GLUCOSE 119* 12/24/2012 1414   BUN 16.8 12/24/2012 1414   CREATININE 1.1 12/24/2012 1414   CALCIUM 9.5 12/24/2012 1414   PROT 7.7 12/24/2012 1414   ALBUMIN 4.2 12/24/2012 1414   AST 36* 12/24/2012 1414   ALT 28 12/24/2012 1414   ALKPHOS 42 12/24/2012 1414   BILITOT 0.63 12/24/2012 1414   No results found for this basename: CHOL, HDL, LDLCALC, LDLDIRECT, TRIG, CHOLHDL  No results found for this basename: HGBA1C   No results found for this basename: VITAMINB12   No results found for this basename: TSH     ASSESSMENT AND PLAN  62 y.o. year old male  has a past medical history of Colon cancer (05/31/2012); Cancer of kidney (05/31/2012); 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06/09/2012); Sleep apnea, obstructive (06/09/2012); HTN (hypertension), benign (06/09/2012); GERD (gastroesophageal reflux disease) (06/09/2012); Glaucoma (06/09/2012); Gastric polyps (06/09/2012); Colon polyps (06/09/2012); Blood transfusion without reported diagnosis (2010); and Hyperlipidemia. here with chemotherapy-induced neuropathy (oxaliplatin). Fortunately patient does not have significantly painful aspects of neuropathy, but rather primarily sensory loss. Therefore I do not think he would benefit from Cymbalta, gabapentin to Lyrica. Symptoms have been stable for almost 3 years.  PLAN: 1. Educated patient on foot care and daily inspections for infection or trauma 2. Caution with exposure to extreme temperature 3. If patient develops painful neuropathy symptoms, then can consider Cymbalta or Lyrica or topical compounded neuropathy creams.    Suanne Marker, MD 01/13/2013, 2:46 PM Certified in  Neurology, Neurophysiology and Neuroimaging  [facility] [address] [place], [place] [address] [phone]

## 2013-01-18 DIAGNOSIS — C649 Malignant neoplasm of unspecified kidney, except renal pelvis: Secondary | ICD-10-CM | POA: Diagnosis not present

## 2013-01-18 DIAGNOSIS — R972 Elevated prostate specific antigen [PSA]: Secondary | ICD-10-CM | POA: Diagnosis not present

## 2013-02-14 ENCOUNTER — Encounter: Payer: Self-pay | Admitting: Genetic Counselor

## 2013-02-14 ENCOUNTER — Other Ambulatory Visit: Payer: Medicare Other | Admitting: Lab

## 2013-02-14 ENCOUNTER — Ambulatory Visit (HOSPITAL_BASED_OUTPATIENT_CLINIC_OR_DEPARTMENT_OTHER): Payer: Medicare Other | Admitting: Genetic Counselor

## 2013-02-14 DIAGNOSIS — C649 Malignant neoplasm of unspecified kidney, except renal pelvis: Secondary | ICD-10-CM

## 2013-02-14 DIAGNOSIS — IMO0002 Reserved for concepts with insufficient information to code with codable children: Secondary | ICD-10-CM | POA: Diagnosis not present

## 2013-02-14 DIAGNOSIS — K317 Polyp of stomach and duodenum: Secondary | ICD-10-CM

## 2013-02-14 DIAGNOSIS — K635 Polyp of colon: Secondary | ICD-10-CM

## 2013-02-14 DIAGNOSIS — C189 Malignant neoplasm of colon, unspecified: Secondary | ICD-10-CM

## 2013-02-14 DIAGNOSIS — C187 Malignant neoplasm of sigmoid colon: Secondary | ICD-10-CM | POA: Diagnosis not present

## 2013-02-14 NOTE — Progress Notes (Signed)
Dr. Cephas Darby requested a consultation for genetic counseling and risk assessment for Scott Hayden, a 62 y.o. male, for discussion of his personal history of clear cell kidney cancer and colon cancer and colon polyps.  He presents to clinic today to discuss the possibility of a genetic predisposition to cancer, and to further clarify his risks, as well as his family members' risks for cancer.   HISTORY OF PRESENT ILLNESS: In 2010, at the age of 36, Scott Hayden was diagnosed with colon cancer of the sigmoid colon. This was treated with surgery and chemotherapy.  At the same time, he was diagnosed with clear cell kidney cancer.  This was treated with sugery, but no chemotherapy or radiation.  Mr. Scott Hayden had previous colonoscopies, around age 28 and 63 which found polyps and put him on a progressively increased screening frequency for colon cancer.  He has had an endoscopy which found gastric polyps.  All treatment was performed in [place].   Past Medical History  Diagnosis Date  . Colon cancer 05/31/2012  . Cancer of kidney 05/31/2012  . 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 Sleep apnea, obstructive 06/09/2012    Never smoker  . HTN (hypertension), benign 06/09/2012  . GERD (gastroesophageal reflux disease) 06/09/2012  . Glaucoma 06/09/2012  . Gastric polyps 06/09/2012  . Colon polyps 06/09/2012  . Blood transfusion without reported diagnosis 2010  . Hyperlipidemia     Past Surgical History  Procedure Laterality Date  . Cataract extraction w/ intraocular lens  implant, bilateral  04/2011 & 12/2011  . Colon surgery  02/2009     Laparoscopic Sigmoid colon resection  . Partial nephrectomy  03/21/12    robotic - on left kidney - cancer  . Knee arthroscopy  09/2007    left  . Inguinal hernia repair  01/1977    left    History   Social History  . Marital Status: Married    Spouse Name: Liborio Nixon    Number of Children: 2  . Years  of Education: MS+   Occupational History  . Retired    Social History Main Topics  . Smoking status: Never Smoker   . Smokeless tobacco: Never Used  . Alcohol Use: 0.0 oz/week     Comment: rarely wine or beer  . Drug Use: No  . Sexually Active: None   Other Topics Concern  . None   Social History Narrative   Pt lives at home with spouse.    Caffeine Use: 2-3 cups daily.     REPRODUCTIVE HISTORY AND PERSONAL RISK ASSESSMENT FACTORS: Colonoscopy: 3x Polyp count: unknown.  The patient is not clear on how many polyps he has had.  His colonoscopy that found the cancer noted a polyp found in addition to the cancer. Smoking: never smoker, but both parents smoked.  FAMILY HISTORY:  We obtained a detailed, 4-generation family history.  Significant diagnoses are listed below: Family History  Problem Relation Age of Onset  . Stomach cancer Mother 25  . Hodgkin's lymphoma Father   . Prostate cancer Father   The patient's sister had a colonoscopy and did not have any polyps found.  His mother had gastric cancer at age 28 and his father had both prostate cancer and hodgkin's lymphoma.  His father emigrated from [place], and the patient has very little contact or information about this side of the family.  Patient's maternal ancestors are of [place] descent, and paternal ancestors  are of [place] descent. There is no reported Ashkenazi Jewish ancestry. There is no known consanguinity.  GENETIC COUNSELING ASSESSMENT: Scott Hayden is a 62 y.o. male with a personal history of colon polyps, and colon and kidney cancer which somewhat suggestive of a hereditary cancer syndrome and predisposition to cancer. We, therefore, discussed and recommended the following at today's visit.   DISCUSSION: We reviewed the characteristics, features and inheritance patterns of hereditary cancer syndromes. We also discussed genetic testing, including the appropriate family members to test, the process of testing,  insurance coverage and turn-around-time for results. Based on his currently reported family history, this is most suggestive of Lynch syndrome between the colon polyps, colon cancer and family history of gastric cancer.  However, he does not meet Amsterdam Criteria, which is required for Medicare guidelines.. We discussed getting medical records to learn what his total lifetime polyp count was, in addition to discussing the case with Dr. Frederica Kuster, the pathologist at [facility] on whether we should request the tumor to do MSI/IHC testing or if we should recommend the hospital in [place] to send this out.  We recommended Deon Pilling pursue genetic testing for MSI/IHC tumor testing, and gather medical records to record his lifetime polyp count.   PLAN: After considering the risks, benefits, and limitations, Norm Wray decided to wait on testing until we are able to gather all records and receive results on his tumor.  If his tumor testing is negative then the chance that his tumor was the result of Lynch syndrome has been greatly reduced.  There are still other genetic conditions that can cause hereditary colon cancer who's incidence is lower, but cannot be ruled out based on MSI/IHC tumor testing. We discussed the implications of a positive, negative and/ or variant of uncertain significance genetic test result. Scott Hayden's questions were answered to his satisfaction today. Our contact information was provided should additional questions or concerns arise.  The patient was seen for a total of 60 minutes, greater than 50% of which was spent face-to-face counseling.  This plan is being carried out per Dr. Feliz Beam recommendations.  This note will also be sent to the referring provider via the electronic medical record. The patient will be supplied with a summary of this genetic counseling discussion as well as educational information on the discussed hereditary cancer syndromes following the conclusion of  their visit.   Patient was discussed with Dr. Drue Second.   _______________________________________________________________________ For Office Staff:  Number of people involved in session: 3 Was an Intern/ student involved with case: yes

## 2013-02-16 DIAGNOSIS — I10 Essential (primary) hypertension: Secondary | ICD-10-CM | POA: Diagnosis not present

## 2013-02-16 DIAGNOSIS — G4733 Obstructive sleep apnea (adult) (pediatric): Secondary | ICD-10-CM | POA: Diagnosis not present

## 2013-02-16 DIAGNOSIS — Z006 Encounter for examination for normal comparison and control in clinical research program: Secondary | ICD-10-CM | POA: Diagnosis not present

## 2013-02-16 DIAGNOSIS — E7800 Pure hypercholesterolemia, unspecified: Secondary | ICD-10-CM | POA: Diagnosis not present

## 2013-02-16 DIAGNOSIS — G620 Drug-induced polyneuropathy: Secondary | ICD-10-CM | POA: Diagnosis not present

## 2013-03-11 DIAGNOSIS — R310 Gross hematuria: Secondary | ICD-10-CM | POA: Diagnosis not present

## 2013-03-11 DIAGNOSIS — R972 Elevated prostate specific antigen [PSA]: Secondary | ICD-10-CM | POA: Diagnosis not present

## 2013-03-11 DIAGNOSIS — C649 Malignant neoplasm of unspecified kidney, except renal pelvis: Secondary | ICD-10-CM | POA: Diagnosis not present

## 2013-03-15 ENCOUNTER — Other Ambulatory Visit: Payer: Self-pay | Admitting: Oncology

## 2013-03-15 DIAGNOSIS — C187 Malignant neoplasm of sigmoid colon: Secondary | ICD-10-CM | POA: Diagnosis not present

## 2013-03-21 DIAGNOSIS — K7689 Other specified diseases of liver: Secondary | ICD-10-CM | POA: Diagnosis not present

## 2013-03-21 DIAGNOSIS — C649 Malignant neoplasm of unspecified kidney, except renal pelvis: Secondary | ICD-10-CM | POA: Diagnosis not present

## 2013-03-25 ENCOUNTER — Encounter: Payer: Self-pay | Admitting: Genetic Counselor

## 2013-04-05 ENCOUNTER — Other Ambulatory Visit (HOSPITAL_COMMUNITY): Payer: Self-pay | Admitting: Urology

## 2013-04-05 DIAGNOSIS — C649 Malignant neoplasm of unspecified kidney, except renal pelvis: Secondary | ICD-10-CM | POA: Diagnosis not present

## 2013-04-05 DIAGNOSIS — R972 Elevated prostate specific antigen [PSA]: Secondary | ICD-10-CM

## 2013-04-07 ENCOUNTER — Telehealth: Payer: Self-pay | Admitting: *Deleted

## 2013-04-07 NOTE — Telephone Encounter (Signed)
Message copied by Orbie Hurst on Thu Apr 07, 2013  3:49 PM ------      Message from: Levert Feinstein      Created: Thu Mar 31, 2013  7:30 AM       Call patient: gene test on his previous biopsy was normal (microsatellite stable). Let us know who he wants to get a copy of report ------

## 2013-04-07 NOTE — Telephone Encounter (Signed)
Spoke with patient.  Let him know that gene test on his previous biopsy was normal.  Patient would like to have a copy ( he will check his My Chart tonight).  And he would like a copy to go to Dr. Renne Crigler.  Copy sent electronically to Dr. Renne Crigler.

## 2013-04-11 ENCOUNTER — Encounter: Payer: Self-pay | Admitting: Oncology

## 2013-04-13 ENCOUNTER — Ambulatory Visit (HOSPITAL_COMMUNITY)
Admission: RE | Admit: 2013-04-13 | Discharge: 2013-04-13 | Disposition: A | Discharge status: Home / Self Care | Payer: Medicare Other | Source: Ambulatory Visit | Attending: Urology | Admitting: Urology

## 2013-04-13 DIAGNOSIS — R972 Elevated prostate specific antigen [PSA]: Secondary | ICD-10-CM | POA: Diagnosis not present

## 2013-04-13 DIAGNOSIS — C61 Malignant neoplasm of prostate: Secondary | ICD-10-CM | POA: Diagnosis not present

## 2013-04-13 DIAGNOSIS — N4283 Cyst of prostate: Secondary | ICD-10-CM | POA: Diagnosis not present

## 2013-04-13 DIAGNOSIS — N402 Nodular prostate without lower urinary tract symptoms: Secondary | ICD-10-CM | POA: Diagnosis not present

## 2013-04-13 MED ORDER — GADOBENATE DIMEGLUMINE 529 MG/ML IV SOLN
20.0000 mL | Freq: Once | INTRAVENOUS | Status: AC | PRN
  Administered 2013-04-13: 20 mL via INTRAVENOUS

## 2013-04-19 ENCOUNTER — Encounter: Payer: Self-pay | Admitting: Genetic Counselor

## 2013-05-09 DIAGNOSIS — C649 Malignant neoplasm of unspecified kidney, except renal pelvis: Secondary | ICD-10-CM | POA: Diagnosis not present

## 2013-05-09 DIAGNOSIS — R310 Gross hematuria: Secondary | ICD-10-CM | POA: Diagnosis not present

## 2013-05-27 DIAGNOSIS — Z0000 Encounter for general adult medical examination without abnormal findings: Secondary | ICD-10-CM | POA: Diagnosis not present

## 2013-05-27 DIAGNOSIS — E7800 Pure hypercholesterolemia, unspecified: Secondary | ICD-10-CM | POA: Diagnosis not present

## 2013-05-27 DIAGNOSIS — I10 Essential (primary) hypertension: Secondary | ICD-10-CM | POA: Diagnosis not present

## 2013-05-27 DIAGNOSIS — Z125 Encounter for screening for malignant neoplasm of prostate: Secondary | ICD-10-CM | POA: Diagnosis not present

## 2013-06-02 ENCOUNTER — Encounter (HOSPITAL_COMMUNITY): Payer: Self-pay | Admitting: Anatomic Pathology & Clinical Pathology

## 2013-06-02 DIAGNOSIS — R209 Unspecified disturbances of skin sensation: Secondary | ICD-10-CM | POA: Diagnosis not present

## 2013-06-02 DIAGNOSIS — K219 Gastro-esophageal reflux disease without esophagitis: Secondary | ICD-10-CM | POA: Diagnosis not present

## 2013-06-02 DIAGNOSIS — E7800 Pure hypercholesterolemia, unspecified: Secondary | ICD-10-CM | POA: Diagnosis not present

## 2013-06-02 DIAGNOSIS — Z23 Encounter for immunization: Secondary | ICD-10-CM | POA: Diagnosis not present

## 2013-06-02 DIAGNOSIS — I10 Essential (primary) hypertension: Secondary | ICD-10-CM | POA: Diagnosis not present

## 2013-06-06 DIAGNOSIS — Z85528 Personal history of other malignant neoplasm of kidney: Secondary | ICD-10-CM | POA: Diagnosis not present

## 2013-06-06 DIAGNOSIS — R972 Elevated prostate specific antigen [PSA]: Secondary | ICD-10-CM | POA: Diagnosis not present

## 2013-06-08 DIAGNOSIS — R42 Dizziness and giddiness: Secondary | ICD-10-CM | POA: Diagnosis not present

## 2013-06-08 DIAGNOSIS — H9319 Tinnitus, unspecified ear: Secondary | ICD-10-CM | POA: Diagnosis not present

## 2013-06-08 DIAGNOSIS — H903 Sensorineural hearing loss, bilateral: Secondary | ICD-10-CM | POA: Diagnosis not present

## 2013-06-09 ENCOUNTER — Encounter (HOSPITAL_COMMUNITY): Payer: Self-pay | Admitting: Oncology

## 2013-06-21 ENCOUNTER — Ambulatory Visit (HOSPITAL_COMMUNITY)
Admission: RE | Admit: 2013-06-21 | Discharge: 2013-06-21 | Disposition: A | Discharge status: Home / Self Care | Payer: Medicare Other | Source: Ambulatory Visit | Attending: Oncology | Admitting: Oncology

## 2013-06-21 ENCOUNTER — Other Ambulatory Visit (HOSPITAL_BASED_OUTPATIENT_CLINIC_OR_DEPARTMENT_OTHER): Payer: Medicare Other

## 2013-06-21 DIAGNOSIS — K635 Polyp of colon: Secondary | ICD-10-CM

## 2013-06-21 DIAGNOSIS — K829 Disease of gallbladder, unspecified: Secondary | ICD-10-CM | POA: Insufficient documentation

## 2013-06-21 DIAGNOSIS — C189 Malignant neoplasm of colon, unspecified: Secondary | ICD-10-CM

## 2013-06-21 DIAGNOSIS — Z85038 Personal history of other malignant neoplasm of large intestine: Secondary | ICD-10-CM | POA: Insufficient documentation

## 2013-06-21 DIAGNOSIS — C642 Malignant neoplasm of left kidney, except renal pelvis: Secondary | ICD-10-CM

## 2013-06-21 DIAGNOSIS — C187 Malignant neoplasm of sigmoid colon: Secondary | ICD-10-CM

## 2013-06-21 DIAGNOSIS — J069 Acute upper respiratory infection, unspecified: Secondary | ICD-10-CM | POA: Diagnosis not present

## 2013-06-21 DIAGNOSIS — D126 Benign neoplasm of colon, unspecified: Secondary | ICD-10-CM | POA: Diagnosis not present

## 2013-06-21 DIAGNOSIS — C649 Malignant neoplasm of unspecified kidney, except renal pelvis: Secondary | ICD-10-CM | POA: Diagnosis not present

## 2013-06-21 DIAGNOSIS — G4733 Obstructive sleep apnea (adult) (pediatric): Secondary | ICD-10-CM | POA: Diagnosis not present

## 2013-06-21 DIAGNOSIS — K7689 Other specified diseases of liver: Secondary | ICD-10-CM | POA: Insufficient documentation

## 2013-06-21 LAB — COMPREHENSIVE METABOLIC PANEL (CC13)
AST: 25 U/L (ref 5–34)
Albumin: 4.1 g/dL (ref 3.5–5.0)
Alkaline Phosphatase: 43 U/L (ref 40–150)
Anion Gap: 12 mEq/L — ABNORMAL HIGH (ref 3–11)
BUN: 11.8 mg/dL (ref 7.0–26.0)
Calcium: 10.4 mg/dL (ref 8.4–10.4)
Chloride: 101 mEq/L (ref 98–109)
Glucose: 95 mg/dl (ref 70–140)
Potassium: 3.9 mEq/L (ref 3.5–5.1)
Sodium: 140 mEq/L (ref 136–145)
Total Protein: 8.1 g/dL (ref 6.4–8.3)

## 2013-06-21 LAB — CBC WITH DIFFERENTIAL/PLATELET
Basophils Absolute: 0 10*3/uL (ref 0.0–0.1)
Eosinophils Absolute: 0.3 10*3/uL (ref 0.0–0.5)
HCT: 43.8 % (ref 38.4–49.9)
HGB: 14.7 g/dL (ref 13.0–17.1)
LYMPH%: 16.3 % (ref 14.0–49.0)
MONO#: 1 10*3/uL — ABNORMAL HIGH (ref 0.1–0.9)
MONO%: 8.5 % (ref 0.0–14.0)
NEUT#: 8.1 10*3/uL — ABNORMAL HIGH (ref 1.5–6.5)
NEUT%: 71.8 % (ref 39.0–75.0)
Platelets: 259 10*3/uL (ref 140–400)
RBC: 4.83 10*6/uL (ref 4.20–5.82)
WBC: 11.3 10*3/uL — ABNORMAL HIGH (ref 4.0–10.3)

## 2013-06-22 LAB — CEA: CEA: 2.5 ng/mL (ref 0.0–5.0)

## 2013-06-23 ENCOUNTER — Other Ambulatory Visit: Payer: Self-pay

## 2013-06-28 ENCOUNTER — Ambulatory Visit (HOSPITAL_BASED_OUTPATIENT_CLINIC_OR_DEPARTMENT_OTHER): Payer: Medicare Other | Admitting: Oncology

## 2013-06-28 ENCOUNTER — Telehealth: Payer: Self-pay | Admitting: Oncology

## 2013-06-28 VITALS — BP 123/85 | HR 71 | Temp 96.8°F | Resp 20 | Ht 70.0 in | Wt 262.0 lb

## 2013-06-28 DIAGNOSIS — G620 Drug-induced polyneuropathy: Secondary | ICD-10-CM | POA: Diagnosis not present

## 2013-06-28 DIAGNOSIS — C187 Malignant neoplasm of sigmoid colon: Secondary | ICD-10-CM | POA: Diagnosis not present

## 2013-06-28 DIAGNOSIS — C642 Malignant neoplasm of left kidney, except renal pelvis: Secondary | ICD-10-CM

## 2013-06-28 DIAGNOSIS — N400 Enlarged prostate without lower urinary tract symptoms: Secondary | ICD-10-CM | POA: Diagnosis not present

## 2013-06-28 DIAGNOSIS — C649 Malignant neoplasm of unspecified kidney, except renal pelvis: Secondary | ICD-10-CM | POA: Diagnosis not present

## 2013-06-28 DIAGNOSIS — C189 Malignant neoplasm of colon, unspecified: Secondary | ICD-10-CM

## 2013-06-28 DIAGNOSIS — I10 Essential (primary) hypertension: Secondary | ICD-10-CM

## 2013-06-28 NOTE — Telephone Encounter (Signed)
Gave pt appt for lab and MD on March 2015 °

## 2013-06-29 ENCOUNTER — Encounter: Payer: Self-pay | Admitting: Oncology

## 2013-07-01 ENCOUNTER — Encounter: Payer: Self-pay | Admitting: *Deleted

## 2013-07-01 NOTE — Progress Notes (Signed)
Hematology and Oncology Follow Up Visit  Scott Hayden [id number] 11-Mar-1951 62 y.o. 07/01/2013 2:24 PM   Principle Diagnosis: Encounter Diagnoses  Name Primary?  . Colon cancer Yes  . Cancer of kidney, left   . 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 2 x 2.5 cm moderately differentiated adenocarcinoma, 2 of 13 lymph nodes positive, T2 N1 arising in a tubulovillous adenoma. He received 12 cycles of FOLFOX chemotherapy over 6 months. He developed a severe distal neuropathy upper and lower extremities.  Part of his initial staging evaluation revealed a 2 cm lesion in his left kidney. When he recovered from chemotherapy for the colon cancer he underwent a right robotic partial left nephrectomy 03/21/2010. Pathology showed a 2 cm clear cell grade 2 lesion with no vascular invasion  He was restaged when he first came to [facility] in October 2013. CT scan of the abdomen and pelvis on 06/16/2012 showed no evidence for new disease.  He is taking gabapentin for his neuropathy without any significant relief. Neuropathy has not improved over time. He feels like he is wearing boots on his feet. Since last visit with me he did see neurology Dr. Geraldine Contras on 01/13/2013. No new medication recommendation made at that time.  He has a history of BPH and an elevated PSA. Biopsies in the past were negative for cancer.  .  He has had no interim medical problems. He reports no abdominal pain or cramping, no change in bowel habits, no hematochezia or melena. No hematuria.   Medications: reviewed  Allergies:  Allergies  Allergen Reactions  . Shellfish Allergy Swelling    Eye puffiness  . Contrast Media [Iodinated Diagnostic Agents] Rash    Erythema on chest w/ scans in [place], pt did fine with 13hr prep  .  Ultravist [Iopromide] Rash    Rash on chest    Review of Systems: ENT ROS: No sore throat Respiratory ROS: No cough or dyspnea Cardiovascular ROS:   No chest pain or palpitations Gastrointestinal ROS:   See above Genito-Urinary ROS: No dysuria or frequency. No hematuria Musculoskeletal ROS: No muscle bone or joint pain Neurological ROS: Persistent neuropathy unchanged Dermatological ROS: No rash or ecchymosis Remaining ROS negative.  Physical Exam: Blood pressure 123/85, pulse 71, temperature 96.8 F (36 C), temperature source Oral, resp. rate 20, height 5\' 10"  (1.778 m), weight 262 lb (118.842 kg). Wt Readings from Last 3 Encounters:  06/28/13 262 lb (118.842 kg)  01/13/13 265 lb (120.203 kg)  12/24/12 267 lb (121.11 kg)     General appearance: Well-nourished Caucasian man HENNT: Pharynx no erythema, exudate, mass, or ulcer. No thyromegaly or thyroid nodules Lymph nodes: No cervical, supraclavicular, or axillary lymphadenopathy Lungs: Clear to auscultation, resonant to percussion throughout Heart: Regular rhythm, no murmur, no gallop, no rub, no click, no edema Abdomen: Soft, nontender, normal bowel sounds, no mass, no organomegaly Extremities: No edema, no calf tenderness Musculoskeletal: no joint deformities GU:  Vascular: Carotid pulses 2+, no bruits, distal pulses: Dorsalis pedis 1+ symmetric Neurologic: Alert, oriented, PERRLA,  cranial nerves grossly normal, motor strength 5 over 5, reflexes 1+ symmetric, upper body coordination normal, gait normal, moderate to severe decreased vibration sensation of her fingertips by tuning fork exam Skin: No rash or ecchymosis  Lab Results: CBC W/Diff  Component Value Date/Time   WBC 11.3* 06/21/2013 0928   RBC 4.83 06/21/2013 0928   HGB 14.7 06/21/2013 0928   HCT 43.8 06/21/2013 0928   PLT 259 06/21/2013 0928   MCV 90.6 06/21/2013 0928   MCH 30.4 06/21/2013 0928   MCHC 33.5 06/21/2013 0928   RDW 13.9 06/21/2013 0928   LYMPHSABS  1.8 06/21/2013 0928   MONOABS 1.0* 06/21/2013 0928   EOSABS 0.3 06/21/2013 0928   BASOSABS 0.0 06/21/2013 0928     Chemistry      Component Value Date/Time   NA 140 06/21/2013 0928   K 3.9 06/21/2013 0928   CL 105 12/24/2012 1414   CO2 27 06/21/2013 0928   BUN 11.8 06/21/2013 0928   CREATININE 1.1 06/21/2013 0928      Component Value Date/Time   CALCIUM 10.4 06/21/2013 0928   ALKPHOS 43 06/21/2013 0928   AST 25 06/21/2013 0928   ALT 26 06/21/2013 0928   BILITOT 0.67 06/21/2013 0928       Radiological Studies: US Abdomen Complete  06/21/2013   CLINICAL DATA:  Follow up left renal cell cancer status post partial nephrectomy. Prior history of colon cancer.  EXAM: ULTRASOUND ABDOMEN COMPLETE  COMPARISON:  CT abdomen dated 03/21/2013  FINDINGS: Gallbladder  3 mm gallbladder polyp versus adherent gallstone (image 56). No gallbladder wall thickening or pericholecystic fluid. Negative sonographic Murphy's sign.  Common bile duct  Diameter: 6 mm  Liver  Hyperechoic hepatic parenchyma, reflecting hepatic steatosis, with focal fatty sparing along the gallbladder fossa.  IVC  No abnormality visualized.  Pancreas  Incompletely visualized but grossly unremarkable.  Spleen  Measures 9.6 cm.  Right Kidney  Length: 13.3 cm. No mass or hydronephrosis.  Left Kidney  Length: 12.2 cm. Postsurgical changes in the interpolar region are not well visualized by ultrasound. No mass or hydronephrosis.  Abdominal aorta  No aneurysm visualized.  IMPRESSION: Hepatic steatosis with focal fatty sparing.  3 mm gallbladder polyp versus adherent gallstone.  Otherwise negative abdominal ultrasound.   Electronically Signed   By: Charline Bills M.D.   On: 06/21/2013 10:44    Impression:  #1. Stage III adenocarcinoma sigmoid colon treated as outlined above.  No gross evidence for new disease now out almost 5 years.  Plan: Continue periodic followup. Repeat CT scan abdomen and pelvis in 6 months along with clinical visit and lab. He has  established himself with Dr. Wendall Papa and will have his next colonoscopy in July of 2015. Likely upper endoscopy at that time as well.  #2. 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  #3. Chemotherapy related peripheral neuropathy.  Continue gabapentin.  #4. BPH with history of elevated PSA.   urology referral made to Dr. Doran Clay #5. Questionable history of Barrett's esophagus.  #6. Polyposis coli   genetic testing. Done at time of last visit does not show any evidence for microsatellite instability..  #7. Essential hypertension  #8. History of gastric polyps. #9. Obstructive sleep apnea on CPAP  He will transition to Dr.Sherrill following his next visit with me.    CC: Patient Care Team: Londell Moh, MD as PCP - General (Internal Medicine)   Levert Feinstein, MD 11/14/20142:24 PM

## 2013-07-06 DIAGNOSIS — R059 Cough, unspecified: Secondary | ICD-10-CM | POA: Diagnosis not present

## 2013-07-06 DIAGNOSIS — B9789 Other viral agents as the cause of diseases classified elsewhere: Secondary | ICD-10-CM | POA: Diagnosis not present

## 2013-08-05 DIAGNOSIS — R972 Elevated prostate specific antigen [PSA]: Secondary | ICD-10-CM | POA: Diagnosis not present

## 2013-08-18 DIAGNOSIS — C61 Malignant neoplasm of prostate: Secondary | ICD-10-CM

## 2013-08-18 HISTORY — DX: Malignant neoplasm of prostate: C61

## 2013-10-10 ENCOUNTER — Telehealth: Payer: Self-pay | Admitting: Oncology

## 2013-10-10 NOTE — Telephone Encounter (Signed)
Called pt and left message regarding new appt time on 3/20 r/s due to on call , per MD, mailed appt

## 2013-10-12 ENCOUNTER — Encounter: Payer: Self-pay | Admitting: Oncology

## 2013-10-15 ENCOUNTER — Encounter: Payer: Self-pay | Admitting: Oncology

## 2013-10-19 ENCOUNTER — Encounter: Payer: Self-pay | Admitting: Oncology

## 2013-10-20 ENCOUNTER — Encounter: Payer: Self-pay | Admitting: *Deleted

## 2013-10-26 ENCOUNTER — Other Ambulatory Visit: Payer: Self-pay | Admitting: Oncology

## 2013-10-26 ENCOUNTER — Ambulatory Visit (HOSPITAL_COMMUNITY)
Admission: RE | Admit: 2013-10-26 | Discharge: 2013-10-26 | Disposition: A | Discharge status: Home / Self Care | Payer: Medicare Other | Source: Ambulatory Visit | Attending: Oncology | Admitting: Oncology

## 2013-10-26 ENCOUNTER — Other Ambulatory Visit (HOSPITAL_BASED_OUTPATIENT_CLINIC_OR_DEPARTMENT_OTHER): Payer: Medicare Other

## 2013-10-26 ENCOUNTER — Encounter (HOSPITAL_COMMUNITY): Payer: Self-pay

## 2013-10-26 DIAGNOSIS — C649 Malignant neoplasm of unspecified kidney, except renal pelvis: Secondary | ICD-10-CM

## 2013-10-26 DIAGNOSIS — R911 Solitary pulmonary nodule: Secondary | ICD-10-CM | POA: Diagnosis not present

## 2013-10-26 DIAGNOSIS — N400 Enlarged prostate without lower urinary tract symptoms: Secondary | ICD-10-CM | POA: Diagnosis not present

## 2013-10-26 DIAGNOSIS — Z85038 Personal history of other malignant neoplasm of large intestine: Secondary | ICD-10-CM | POA: Diagnosis not present

## 2013-10-26 DIAGNOSIS — R918 Other nonspecific abnormal finding of lung field: Secondary | ICD-10-CM | POA: Diagnosis not present

## 2013-10-26 DIAGNOSIS — C189 Malignant neoplasm of colon, unspecified: Secondary | ICD-10-CM

## 2013-10-26 DIAGNOSIS — Z905 Acquired absence of kidney: Secondary | ICD-10-CM | POA: Diagnosis not present

## 2013-10-26 DIAGNOSIS — C642 Malignant neoplasm of left kidney, except renal pelvis: Secondary | ICD-10-CM

## 2013-10-26 DIAGNOSIS — Z85528 Personal history of other malignant neoplasm of kidney: Secondary | ICD-10-CM | POA: Insufficient documentation

## 2013-10-26 DIAGNOSIS — Z9049 Acquired absence of other specified parts of digestive tract: Secondary | ICD-10-CM | POA: Diagnosis not present

## 2013-10-26 DIAGNOSIS — R935 Abnormal findings on diagnostic imaging of other abdominal regions, including retroperitoneum: Secondary | ICD-10-CM | POA: Diagnosis not present

## 2013-10-26 LAB — COMPREHENSIVE METABOLIC PANEL (CC13)
ALK PHOS: 41 U/L (ref 40–150)
ALT: 32 U/L (ref 0–55)
AST: 28 U/L (ref 5–34)
Albumin: 4.5 g/dL (ref 3.5–5.0)
Anion Gap: 12 mEq/L — ABNORMAL HIGH (ref 3–11)
BILIRUBIN TOTAL: 0.55 mg/dL (ref 0.20–1.20)
BUN: 15.6 mg/dL (ref 7.0–26.0)
CO2: 24 mEq/L (ref 22–29)
CREATININE: 1.1 mg/dL (ref 0.7–1.3)
Calcium: 10.3 mg/dL (ref 8.4–10.4)
Chloride: 103 mEq/L (ref 98–109)
Glucose: 162 mg/dl — ABNORMAL HIGH (ref 70–140)
Potassium: 4 mEq/L (ref 3.5–5.1)
Sodium: 139 mEq/L (ref 136–145)
Total Protein: 8 g/dL (ref 6.4–8.3)

## 2013-10-26 LAB — CBC WITH DIFFERENTIAL/PLATELET
BASO%: 0.2 % (ref 0.0–2.0)
Basophils Absolute: 0 10*3/uL (ref 0.0–0.1)
EOS%: 0.2 % (ref 0.0–7.0)
Eosinophils Absolute: 0 10*3/uL (ref 0.0–0.5)
HEMATOCRIT: 44.9 % (ref 38.4–49.9)
HGB: 15.1 g/dL (ref 13.0–17.1)
LYMPH#: 1.4 10*3/uL (ref 0.9–3.3)
LYMPH%: 16.2 % (ref 14.0–49.0)
MCH: 30.4 pg (ref 27.2–33.4)
MCHC: 33.7 g/dL (ref 32.0–36.0)
MCV: 90.3 fL (ref 79.3–98.0)
MONO#: 0.2 10*3/uL (ref 0.1–0.9)
MONO%: 2.3 % (ref 0.0–14.0)
NEUT#: 6.9 10*3/uL — ABNORMAL HIGH (ref 1.5–6.5)
NEUT%: 81.1 % — AB (ref 39.0–75.0)
Platelets: 265 10*3/uL (ref 140–400)
RBC: 4.97 10*6/uL (ref 4.20–5.82)
RDW: 13.4 % (ref 11.0–14.6)
WBC: 8.5 10*3/uL (ref 4.0–10.3)

## 2013-10-26 LAB — LACTATE DEHYDROGENASE (CC13): LDH: 170 U/L (ref 125–245)

## 2013-10-26 MED ORDER — IOHEXOL 300 MG/ML  SOLN
125.0000 mL | Freq: Once | INTRAMUSCULAR | Status: AC | PRN
  Administered 2013-10-26: 125 mL via INTRAVENOUS

## 2013-10-27 ENCOUNTER — Telehealth: Payer: Self-pay | Admitting: *Deleted

## 2013-10-27 LAB — CEA: CEA: 2.1 ng/mL (ref 0.0–5.0)

## 2013-10-27 NOTE — Telephone Encounter (Signed)
Spoke with patient.  Let him know that lab and CT are both good.  He had already seen results, but he appreciated the call.

## 2013-10-27 NOTE — Telephone Encounter (Signed)
Message copied by Ignacia Felling on Thu Oct 27, 2013  3:50 PM ------      Message from: Annia Belt      Created: Thu Oct 27, 2013  7:30 AM       Call pt: lab & CT good ------

## 2013-10-28 ENCOUNTER — Ambulatory Visit: Payer: Medicare Other | Admitting: Oncology

## 2013-10-31 DIAGNOSIS — H409 Unspecified glaucoma: Secondary | ICD-10-CM | POA: Diagnosis not present

## 2013-10-31 DIAGNOSIS — H4011X0 Primary open-angle glaucoma, stage unspecified: Secondary | ICD-10-CM | POA: Diagnosis not present

## 2013-10-31 DIAGNOSIS — H5210 Myopia, unspecified eye: Secondary | ICD-10-CM | POA: Diagnosis not present

## 2013-11-04 ENCOUNTER — Ambulatory Visit (HOSPITAL_BASED_OUTPATIENT_CLINIC_OR_DEPARTMENT_OTHER): Payer: Medicare Other | Admitting: Oncology

## 2013-11-04 ENCOUNTER — Telehealth: Payer: Self-pay | Admitting: Hematology and Oncology

## 2013-11-04 VITALS — BP 136/86 | HR 85 | Temp 98.2°F | Resp 18 | Ht 70.0 in | Wt 274.0 lb

## 2013-11-04 DIAGNOSIS — K317 Polyp of stomach and duodenum: Secondary | ICD-10-CM

## 2013-11-04 DIAGNOSIS — R972 Elevated prostate specific antigen [PSA]: Secondary | ICD-10-CM

## 2013-11-04 DIAGNOSIS — G4733 Obstructive sleep apnea (adult) (pediatric): Secondary | ICD-10-CM

## 2013-11-04 DIAGNOSIS — C187 Malignant neoplasm of sigmoid colon: Secondary | ICD-10-CM

## 2013-11-04 DIAGNOSIS — G622 Polyneuropathy due to other toxic agents: Secondary | ICD-10-CM | POA: Diagnosis not present

## 2013-11-04 DIAGNOSIS — K635 Polyp of colon: Secondary | ICD-10-CM

## 2013-11-04 DIAGNOSIS — C649 Malignant neoplasm of unspecified kidney, except renal pelvis: Secondary | ICD-10-CM | POA: Diagnosis not present

## 2013-11-04 DIAGNOSIS — C189 Malignant neoplasm of colon, unspecified: Secondary | ICD-10-CM

## 2013-11-04 DIAGNOSIS — I10 Essential (primary) hypertension: Secondary | ICD-10-CM | POA: Diagnosis not present

## 2013-11-04 NOTE — Progress Notes (Signed)
Hematology and Oncology Follow Up Visit  Scott Hayden [id number] [birth year] 63 y.o. 11/04/2013 5:46 PM   Principle Diagnosis: Encounter Diagnoses  Name Primary?  . Colon cancer Yes  . Cancer of kidney   . Colon polyps   . 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 He received 12 cycles of FOLFOX chemotherapy over 6 months. He developed a severe distal neuropathy, upper and lower extremities.  During his initial staging evaluation, CT scan revealed a 2 cm lesion in his left kidney. When he recovered from chemotherapy for the colon cancer he underwent a right robotic partial left nephrectomy 03/21/2010. Pathology showed a 2 cm clear cell grade 2 lesion with no vascular invasion or is all is He was restaged when he first came to [facility] in October 2013. CT scan of the abdomen and pelvis on 06/16/2012 showed no evidence for new disease.  He is taking gabapentin for his neuropathy without any significant relief. Neuropathy has not improved over time. He feels like he is wearing boots on his feet. Since last visit with me he did see neurology Dr. Lonni Fix on 01/13/2013. No new medication recommendation made at that time.   He has a history of BPH and an elevated PSA. Biopsies in the past were negative for cancer.  .  He has had no interim medical problems. He reports no abdominal pain or cramping, no change in bowel habits, no hematochezia or melena. No hematuria.    Medications: reviewed  Allergies:  Allergies  Allergen Reactions  . Shellfish Allergy Swelling    Eye puffiness  . Contrast Media [Iodinated Diagnostic Agents] Rash    Erythema on chest w/ scans in [place], pt  did fine with 13hr prep  . Ultravist [Iopromide] Rash    Rash on chest    Review of Systems: Hematology:  No bleeding or bruising ENT ROS: No sore throat Breast ROS:  Respiratory ROS: No cough or dyspnea Cardiovascular ROS:  No chest pain or palpitations Gastrointestinal ROS: See above   Genito-Urinary ROS: No urinary tract symptoms. No hematuria Musculoskeletal ROS: No muscle bone or joint pain Neurological ROS: Persistent neuropathy Dermatological ROS: No rash or ecchymosis Remaining ROS negative:   Physical Exam: Blood pressure 136/86, pulse 85, temperature 98.2 F (36.8 C), temperature source Oral, resp. rate 18, height '5\' 10"'  (1.778 m), weight 274 lb (124.286 kg). Wt Readings from Last 3 Encounters:  11/04/13 274 lb (124.286 kg)  06/28/13 262 lb (118.842 kg)  01/13/13 265 lb (120.203 kg)     General appearance: Tall, well-nourished Caucasian man HENNT: Pharynx no erythema, exudate, mass, or ulcer. No thyromegaly or thyroid nodules Lymph nodes: No cervical, supraclavicular, or axillary lymphadenopathy Breasts: Lungs: Clear to auscultation, resonant to percussion throughout Heart: Regular rhythm, no murmur, no gallop, no rub, no click, no edema Abdomen: Soft, nontender, normal bowel sounds, no mass, no organomegaly Extremities: No edema, no calf tenderness Musculoskeletal: no joint deformities GU:  Vascular: Carotid pulses 2+,  Neurologic: Alert, oriented, PERRLA,  , cranial nerves grossly normal, motor strength 5 over 5, reflexes 1+ symmetric, upper body coordination normal, gait normal,  vibration not tested today Skin: No rash or ecchymosis  Lab Results: CBC W/Diff    Component Value Date/Time   WBC 8.5 10/26/2013 0755   RBC 4.97 10/26/2013 0755   HGB 15.1 10/26/2013 0755   HCT 44.9 10/26/2013 0755   PLT 265 10/26/2013 0755   MCV 90.3 10/26/2013 0755   MCH 30.4 10/26/2013 0755   MCHC 33.7 10/26/2013 0755   RDW 13.4 10/26/2013 0755   LYMPHSABS 1.4 10/26/2013 0755    MONOABS 0.2 10/26/2013 0755   EOSABS 0.0 10/26/2013 0755   BASOSABS 0.0 10/26/2013 0755     Chemistry      Component Value Date/Time   NA 139 10/26/2013 0755   K 4.0 10/26/2013 0755   CL 105 12/24/2012 1414   CO2 24 10/26/2013 0755   BUN 15.6 10/26/2013 0755   CREATININE 1.1 10/26/2013 0755      Component Value Date/Time   CALCIUM 10.3 10/26/2013 0755   ALKPHOS 41 10/26/2013 0755   AST 28 10/26/2013 0755   ALT 32 10/26/2013 0755   BILITOT 0.55 10/26/2013 0755       Radiological Studies: Ct Abdomen Pelvis W Wo Contrast  10/26/2013   CLINICAL DATA History of colon cancer and renal cell carcinoma. Partial left nephrectomy and colon resection.  EXAM CT ABDOMEN AND PELVIS WITHOUT AND WITH CONTRAST  TECHNIQUE Multidetector CT imaging of the abdomen and pelvis was performed without contrast material in one or both body regions, followed by contrast material(s) and further sections in one or both body regions.  CONTRAST 154m OMNIPAQUE IOHEXOL 300 MG/ML  SOLN  COMPARISON MRI from 04/13/2013.  CT scan from 03/21/2013.  FINDINGS 4 mm right middle lobe nodule seen on image 3 is unchanged and has been stable back to 06/16/2012.  No focal abnormality is seen in the liver. 6 mm hypervascular foci in the dome and anterior spleen are unchanged since the prior CT scan. The stomach, duodenum, pancreas, gallbladder, and abdominal bowel loops are unremarkable. There is thickening of both adrenal glands without adrenal nodular mass. Right kidney is unremarkable. Defect in the upper pole of the left kidney is compatible with reported history of partial nephrectomy.  No abdominal aortic aneurysm. There is no lymphadenopathy or free fluid in the abdomen.  IMPRESSION Stable CT evaluation of the abdomen. Specifically, no evidence for recurrent or metastatic disease in this patient status post partial left nephrectomy.  No focal abnormality within the liver.  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  4 mm right middle lobe pulmonary nodule unchanged since 06/16/2012, likely a benign process such as scarring.  SIGNATURE  Electronically Signed   By: EMisty StanleyM.D.   On: 10/26/2013 12:15   Dg Chest 2 View  10/26/2013   CLINICAL DATA Follow-up for history of colon and kidney cancer  EXAM CHEST  2 VIEW  COMPARISON 06/16/2012  FINDINGS Lungs are clear. No pleural effusion or pneumothorax.  The heart is normal in size.  Mild degenerative changes of the visualized thoracolumbar spine.  IMPRESSION No evidence of acute cardiopulmonary disease.  SIGNATURE  Electronically Signed   By: SJulian HyM.D.   On: 10/26/2013 08:56    Impression:  #1. Stage III adenocarcinoma sigmoid colon treated as outlined above.  No gross evidence for new disease now out almost 6 years.  Plan: Continue periodic followup. Repeat CT scan abdomen and pelvis in 6 months along with clinical visit and lab. He has established himself with Dr. DOretha Capriceand will have his next  colonoscopy in July of 2015. Likely upper endoscopy at that time as well.   #2. Polyposis coli  genetic testing. done at time of 5/14 visit does not show any evidence for microsatellite instability..   #3.Stage I, Fuhrman grade 2, clear cell carcinoma left kidney status post partial left nephrectomy.   #4. Chemotherapy related peripheral neuropathy.  Continue gabapentin.   #5. BPH with history of elevated PSA.  urology referral made to Dr. Lorn Junes   #6. Questionable history of Barrett's esophagus.   #7. Essential hypertension   #8. History of gastric polyps.   #9. Obstructive sleep apnea on CPAP   He will transition to Dr. Alvy Bimler.     CC: Patient Care Team: Horatio Pel, MD as PCP - General (Internal Medicine)   Annia Belt, MD 3/20/20155:46 PM

## 2013-11-04 NOTE — Telephone Encounter (Signed)
gv adn printed appt sched and avs for pt for SEpt....gv pt barium °

## 2013-11-07 DIAGNOSIS — C649 Malignant neoplasm of unspecified kidney, except renal pelvis: Secondary | ICD-10-CM | POA: Diagnosis not present

## 2013-11-07 DIAGNOSIS — R972 Elevated prostate specific antigen [PSA]: Secondary | ICD-10-CM | POA: Diagnosis not present

## 2013-11-14 ENCOUNTER — Other Ambulatory Visit: Payer: Self-pay | Admitting: Urology

## 2013-11-14 DIAGNOSIS — R972 Elevated prostate specific antigen [PSA]: Secondary | ICD-10-CM | POA: Diagnosis not present

## 2013-11-14 DIAGNOSIS — R310 Gross hematuria: Secondary | ICD-10-CM | POA: Diagnosis not present

## 2013-11-14 DIAGNOSIS — C649 Malignant neoplasm of unspecified kidney, except renal pelvis: Secondary | ICD-10-CM | POA: Diagnosis not present

## 2013-11-22 ENCOUNTER — Encounter (HOSPITAL_BASED_OUTPATIENT_CLINIC_OR_DEPARTMENT_OTHER): Payer: Self-pay | Admitting: *Deleted

## 2013-11-23 ENCOUNTER — Encounter (HOSPITAL_BASED_OUTPATIENT_CLINIC_OR_DEPARTMENT_OTHER): Payer: Self-pay | Admitting: *Deleted

## 2013-11-23 NOTE — Progress Notes (Signed)
NPO AFTER MN. ARRIVE AT 0700. NEEDS ISTAT 8 AND EKG. WILL TAKE TOPROL AM DOS W/ SIPS OF WATER AND WILL DO FLEET ENEMA.

## 2013-11-29 NOTE — Anesthesia Preprocedure Evaluation (Addendum)
Anesthesia Evaluation  Patient identified by MRN, date of birth, ID band Patient awake    Reviewed: Allergy & Precautions, H&P , NPO status , Patient's Chart, lab work & pertinent test results  Airway Mallampati: II TM Distance: <3 FB Neck ROM: Full    Dental no notable dental hx.    Pulmonary sleep apnea and Continuous Positive Airway Pressure Ventilation ,  breath sounds clear to auscultation  Pulmonary exam normal       Cardiovascular hypertension, Pt. on medications and Pt. on home beta blockers Rhythm:Regular Rate:Normal     Neuro/Psych negative neurological ROS  negative psych ROS   GI/Hepatic negative GI ROS, Neg liver ROS,   Endo/Other  negative endocrine ROS  Renal/GU   negative genitourinary   Musculoskeletal negative musculoskeletal ROS (+)   Abdominal   Peds negative pediatric ROS (+)  Hematology negative hematology ROS (+)   Anesthesia Other Findings   Reproductive/Obstetrics negative OB ROS                          Anesthesia Physical Anesthesia Plan  ASA: III  Anesthesia Plan: General   Post-op Pain Management:    Induction: Intravenous  Airway Management Planned: LMA  Additional Equipment:   Intra-op Plan:   Post-operative Plan:   Informed Consent: I have reviewed the patients History and Physical, chart, labs and discussed the procedure including the risks, benefits and alternatives for the proposed anesthesia with the patient or authorized representative who has indicated his/her understanding and acceptance.   Dental advisory given  Plan Discussed with: CRNA and Surgeon  Anesthesia Plan Comments:         Anesthesia Quick Evaluation

## 2013-11-30 ENCOUNTER — Encounter (HOSPITAL_BASED_OUTPATIENT_CLINIC_OR_DEPARTMENT_OTHER): Payer: Medicare Other | Admitting: Anesthesiology

## 2013-11-30 ENCOUNTER — Other Ambulatory Visit: Payer: Self-pay

## 2013-11-30 ENCOUNTER — Ambulatory Visit (HOSPITAL_BASED_OUTPATIENT_CLINIC_OR_DEPARTMENT_OTHER)
Admission: RE | Admit: 2013-11-30 | Discharge: 2013-11-30 | Disposition: A | Discharge status: Home / Self Care | Payer: Medicare Other | Source: Ambulatory Visit | Attending: Urology | Admitting: Urology

## 2013-11-30 ENCOUNTER — Ambulatory Visit (HOSPITAL_BASED_OUTPATIENT_CLINIC_OR_DEPARTMENT_OTHER): Payer: Medicare Other | Admitting: Anesthesiology

## 2013-11-30 ENCOUNTER — Encounter (HOSPITAL_BASED_OUTPATIENT_CLINIC_OR_DEPARTMENT_OTHER): Payer: Self-pay | Admitting: *Deleted

## 2013-11-30 ENCOUNTER — Encounter (HOSPITAL_BASED_OUTPATIENT_CLINIC_OR_DEPARTMENT_OTHER)
Admission: RE | Disposition: A | Discharge status: Home / Self Care | Payer: Self-pay | Source: Ambulatory Visit | Attending: Urology

## 2013-11-30 DIAGNOSIS — C61 Malignant neoplasm of prostate: Secondary | ICD-10-CM | POA: Diagnosis not present

## 2013-11-30 DIAGNOSIS — Z8553 Personal history of malignant neoplasm of renal pelvis: Secondary | ICD-10-CM | POA: Insufficient documentation

## 2013-11-30 DIAGNOSIS — Z905 Acquired absence of kidney: Secondary | ICD-10-CM | POA: Insufficient documentation

## 2013-11-30 DIAGNOSIS — Z91041 Radiographic dye allergy status: Secondary | ICD-10-CM | POA: Diagnosis not present

## 2013-11-30 DIAGNOSIS — Z91013 Allergy to seafood: Secondary | ICD-10-CM | POA: Insufficient documentation

## 2013-11-30 DIAGNOSIS — Z800 Family history of malignant neoplasm of digestive organs: Secondary | ICD-10-CM | POA: Diagnosis not present

## 2013-11-30 DIAGNOSIS — Z8042 Family history of malignant neoplasm of prostate: Secondary | ICD-10-CM | POA: Insufficient documentation

## 2013-11-30 DIAGNOSIS — E785 Hyperlipidemia, unspecified: Secondary | ICD-10-CM | POA: Diagnosis not present

## 2013-11-30 DIAGNOSIS — Z9221 Personal history of antineoplastic chemotherapy: Secondary | ICD-10-CM | POA: Diagnosis not present

## 2013-11-30 DIAGNOSIS — R911 Solitary pulmonary nodule: Secondary | ICD-10-CM | POA: Diagnosis not present

## 2013-11-30 DIAGNOSIS — G4733 Obstructive sleep apnea (adult) (pediatric): Secondary | ICD-10-CM | POA: Diagnosis not present

## 2013-11-30 DIAGNOSIS — R972 Elevated prostate specific antigen [PSA]: Secondary | ICD-10-CM | POA: Diagnosis not present

## 2013-11-30 DIAGNOSIS — K219 Gastro-esophageal reflux disease without esophagitis: Secondary | ICD-10-CM | POA: Diagnosis not present

## 2013-11-30 DIAGNOSIS — Z85038 Personal history of other malignant neoplasm of large intestine: Secondary | ICD-10-CM | POA: Diagnosis not present

## 2013-11-30 DIAGNOSIS — I10 Essential (primary) hypertension: Secondary | ICD-10-CM | POA: Insufficient documentation

## 2013-11-30 DIAGNOSIS — Z9049 Acquired absence of other specified parts of digestive tract: Secondary | ICD-10-CM | POA: Diagnosis not present

## 2013-11-30 HISTORY — DX: Elevated prostate specific antigen (PSA): R97.20

## 2013-11-30 HISTORY — DX: Personal history of other malignant neoplasm of kidney: Z85.528

## 2013-11-30 HISTORY — DX: Dependence on other enabling machines and devices: Z99.89

## 2013-11-30 HISTORY — DX: Solitary pulmonary nodule: R91.1

## 2013-11-30 HISTORY — DX: Presence of external hearing-aid: Z97.4

## 2013-11-30 HISTORY — DX: Personal history of colonic polyps: Z86.010

## 2013-11-30 HISTORY — DX: Personal history of colon polyps, unspecified: Z86.0100

## 2013-11-30 HISTORY — PX: PROSTATE BIOPSY: SHX241

## 2013-11-30 HISTORY — DX: Personal history of other diseases of the digestive system: Z87.19

## 2013-11-30 HISTORY — DX: Personal history of other malignant neoplasm of large intestine: Z85.038

## 2013-11-30 HISTORY — DX: Obstructive sleep apnea (adult) (pediatric): G47.33

## 2013-11-30 HISTORY — DX: Drug-induced polyneuropathy: G62.0

## 2013-11-30 HISTORY — DX: Drug-induced polyneuropathy: T45.1X5A

## 2013-11-30 HISTORY — DX: Presence of spectacles and contact lenses: Z97.3

## 2013-11-30 HISTORY — DX: Essential (primary) hypertension: I10

## 2013-11-30 HISTORY — DX: Benign prostatic hyperplasia without lower urinary tract symptoms: N40.0

## 2013-11-30 LAB — POCT I-STAT, CHEM 8
BUN: 17 mg/dL (ref 6–23)
CHLORIDE: 100 meq/L (ref 96–112)
CREATININE: 1.3 mg/dL (ref 0.50–1.35)
Calcium, Ion: 1.32 mmol/L — ABNORMAL HIGH (ref 1.13–1.30)
GLUCOSE: 97 mg/dL (ref 70–99)
HCT: 42 % (ref 39.0–52.0)
Hemoglobin: 14.3 g/dL (ref 13.0–17.0)
Potassium: 3.6 mEq/L — ABNORMAL LOW (ref 3.7–5.3)
Sodium: 143 mEq/L (ref 137–147)
TCO2: 24 mmol/L (ref 0–100)

## 2013-11-30 SURGERY — BIOPSY, PROSTATE, RECTAL APPROACH, WITH US GUIDANCE
Anesthesia: General | Site: Rectum

## 2013-11-30 MED ORDER — MIDAZOLAM HCL 5 MG/5ML IJ SOLN
INTRAMUSCULAR | Status: DC | PRN
  Administered 2013-11-30: 2 mg via INTRAVENOUS

## 2013-11-30 MED ORDER — PROMETHAZINE HCL 25 MG/ML IJ SOLN
6.2500 mg | INTRAMUSCULAR | Status: DC | PRN
  Filled 2013-11-30: qty 1

## 2013-11-30 MED ORDER — TRAMADOL HCL 50 MG PO TABS: 50.0000 mg | ORAL_TABLET | Freq: Four times a day (QID) | ORAL | Status: DC | PRN

## 2013-11-30 MED ORDER — PROPOFOL 10 MG/ML IV BOLUS
INTRAVENOUS | Status: DC | PRN
  Administered 2013-11-30: 300 mg via INTRAVENOUS

## 2013-11-30 MED ORDER — MIDAZOLAM HCL 2 MG/2ML IJ SOLN
INTRAMUSCULAR | Status: AC
  Filled 2013-11-30: qty 2

## 2013-11-30 MED ORDER — LACTATED RINGERS IV SOLN
INTRAVENOUS | Status: DC
  Administered 2013-11-30: 07:00:00 via INTRAVENOUS
  Filled 2013-11-30: qty 1000

## 2013-11-30 MED ORDER — FENTANYL CITRATE 0.05 MG/ML IJ SOLN
INTRAMUSCULAR | Status: AC
  Filled 2013-11-30: qty 6

## 2013-11-30 MED ORDER — ONDANSETRON HCL 4 MG/2ML IJ SOLN
INTRAMUSCULAR | Status: DC | PRN
  Administered 2013-11-30: 4 mg via INTRAVENOUS

## 2013-11-30 MED ORDER — METOCLOPRAMIDE HCL 5 MG/ML IJ SOLN
INTRAMUSCULAR | Status: DC | PRN
  Administered 2013-11-30: 10 mg via INTRAVENOUS

## 2013-11-30 MED ORDER — ACETAMINOPHEN 10 MG/ML IV SOLN
INTRAVENOUS | Status: DC | PRN
  Administered 2013-11-30: 1000 mg via INTRAVENOUS

## 2013-11-30 MED ORDER — DEXAMETHASONE SODIUM PHOSPHATE 4 MG/ML IJ SOLN
INTRAMUSCULAR | Status: DC | PRN
  Administered 2013-11-30: 10 mg via INTRAVENOUS

## 2013-11-30 MED ORDER — FENTANYL CITRATE 0.05 MG/ML IJ SOLN
25.0000 ug | INTRAMUSCULAR | Status: DC | PRN
  Filled 2013-11-30: qty 1

## 2013-11-30 MED ORDER — DEXTROSE 5 % IV SOLN
5.0000 mg/kg | INTRAVENOUS | Status: AC
  Administered 2013-11-30: 480 mg via INTRAVENOUS
  Filled 2013-11-30 (×2): qty 12

## 2013-11-30 MED ORDER — EPHEDRINE SULFATE 50 MG/ML IJ SOLN
INTRAMUSCULAR | Status: DC | PRN
  Administered 2013-11-30 (×2): 10 mg via INTRAVENOUS

## 2013-11-30 MED ORDER — GENTAMICIN IN SALINE 1.6-0.9 MG/ML-% IV SOLN
80.0000 mg | INTRAVENOUS | Status: DC
  Filled 2013-11-30: qty 50

## 2013-11-30 MED ORDER — KETOROLAC TROMETHAMINE 30 MG/ML IJ SOLN
INTRAMUSCULAR | Status: DC | PRN
  Administered 2013-11-30: 30 mg via INTRAVENOUS

## 2013-11-30 MED ORDER — FENTANYL CITRATE 0.05 MG/ML IJ SOLN
INTRAMUSCULAR | Status: DC | PRN
  Administered 2013-11-30 (×5): 25 ug via INTRAVENOUS
  Administered 2013-11-30: 50 ug via INTRAVENOUS
  Administered 2013-11-30: 25 ug via INTRAVENOUS

## 2013-11-30 MED ORDER — LIDOCAINE HCL (CARDIAC) 20 MG/ML IV SOLN
INTRAVENOUS | Status: DC | PRN
  Administered 2013-11-30: 100 mg via INTRAVENOUS

## 2013-11-30 MED ORDER — LACTATED RINGERS IV SOLN
INTRAVENOUS | Status: DC | PRN
  Administered 2013-11-30 (×2): via INTRAVENOUS

## 2013-11-30 MED ORDER — LIDOCAINE HCL 2 % IJ SOLN
INTRAMUSCULAR | Status: DC | PRN
  Administered 2013-11-30: 9 mL

## 2013-11-30 SURGICAL SUPPLY — 10 items
DERMABOND ADVANCED (GAUZE/BANDAGES/DRESSINGS) ×1
DERMABOND ADVANCED .7 DNX12 (GAUZE/BANDAGES/DRESSINGS) ×1 IMPLANT
GLOVE BIOGEL M 7.0 STRL (GLOVE) ×2 IMPLANT
GOWN STRL REUS W/TWL XL LVL3 (GOWN DISPOSABLE) ×2 IMPLANT
LEGGING LITHOTOMY PAIR STRL (DRAPES) ×2 IMPLANT
NDL SAFETY ECLIPSE 18X1.5 (NEEDLE) IMPLANT
NEEDLE HYPO 18GX1.5 SHARP (NEEDLE)
NEEDLE HYPO 22GX1.5 SAFETY (NEEDLE) ×2 IMPLANT
SYR CONTROL 10ML LL (SYRINGE) ×2 IMPLANT
UNDERPAD 30X30 INCONTINENT (UNDERPADS AND DIAPERS) ×2 IMPLANT

## 2013-11-30 NOTE — Anesthesia Procedure Notes (Signed)
Procedure Name: LMA Insertion Date/Time: 11/30/2013 8:30 AM Performed by: Justice Rocher Pre-anesthesia Checklist: Patient identified, Emergency Drugs available, Suction available and Patient being monitored Patient Re-evaluated:Patient Re-evaluated prior to inductionOxygen Delivery Method: Circle System Utilized Preoxygenation: Pre-oxygenation with 100% oxygen Intubation Type: IV induction Ventilation: Mask ventilation without difficulty LMA: LMA inserted LMA Size: 5.0 Number of attempts: 1 Airway Equipment and Method: bite block Placement Confirmation: positive ETCO2 Tube secured with: Tape Dental Injury: Teeth and Oropharynx as per pre-operative assessment
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
## 2013-11-30 NOTE — Anesthesia Postprocedure Evaluation (Signed)
  Anesthesia Post-op Note  Patient: Scott Hayden  Procedure(s) Performed: Procedure(s) (LRB): SATURATION BIOPSY TRANSRECTAL ULTRASONIC PROSTATE (TUBP) (N/A)  Patient Location: PACU  Anesthesia Type: General  Level of Consciousness: awake and alert   Airway and Oxygen Therapy: Patient Spontanous Breathing  Post-op Pain: mild  Post-op Assessment: Post-op Vital signs reviewed, Patient's Cardiovascular Status Stable, Respiratory Function Stable, Patent Airway and No signs of Nausea or vomiting  Last Vitals:  Filed Vitals:   11/30/13 0707  BP: 134/84  Pulse: 78  Temp: 36.1 C  Resp: 16    Post-op Vital Signs: stable   Complications: No apparent anesthesia complications

## 2013-11-30 NOTE — Discharge Instructions (Signed)
1 - You may have urinary urgency (bladder spasms) and bloody urine and stool on / off for several days. You may notice blood / rust color to ejaculate for several weeks. This is normal.  2 - Call MD or go to ER for fever >102, severe pain / nausea / vomiting not relieved by medications, or acute change in medical status  Post Anesthesia Home Care Instructions  Activity: Get plenty of rest for the remainder of the day. A responsible adult should stay with you for 24 hours following the procedure.  For the next 24 hours, DO NOT: -Drive a car -Paediatric nurse -Drink alcoholic beverages -Take any medication unless instructed by your physician -Make any legal decisions or sign important papers.  Meals: Start with liquid foods such as gelatin or soup. Progress to regular foods as tolerated. Avoid greasy, spicy, heavy foods. If nausea and/or vomiting occur, drink only clear liquids until the nausea and/or vomiting subsides. Call your physician if vomiting continues.  Special Instructions/Symptoms: Your throat may feel dry or sore from the anesthesia or the breathing tube placed in your throat during surgery. If this causes discomfort, gargle with warm salt water. The discomfort should disappear within 24 hours. Transurethral Resection of the Prostate Care After Refer to this sheet in the next few weeks. These instructions provide you with information on caring for yourself after your procedure. Your caregiver also may give you specific instructions. Your treatment has been planned according to current medical practices, but complications sometimes occur. Call your caregiver if you have any problems or questions after your procedure. HOME CARE INSTRUCTIONS  Recovery can take 4 6 weeks. Avoid alcohol, caffeinated drinks, and spicy foods for 2 weeks after your procedure. Drink enough fluids to keep your urine clear or pale yellow. Urinate as soon as you feel the urge to do so. Do not try to hold your  urine for long periods of time. During recovery you may experience pain caused by bladder spasms, which result in a very intense urge to urinate. Take all medicines as directed by your caregiver, including medicines for pain. Try to limit the amount of pain medicines you take because it can cause constipation. If you do become constipated, do not strain to move your bowels. Straining can increase bleeding. Constipation can be minimized by increasing the amount fluids and fiber in your diet. Your caregiver also may prescribe a stool softener. Do not lift heavy objects (more than 5 lb [2.25 kg]) or perform exercises that cause you to strain for at least 1 month after your procedure. When sitting, you may want to sit in a soft chair or use a cushion. For the first 10 days after your procedure, avoid the following activities:  Running.  Strenuous work.  Long walks.  Riding in a car for extended periods.  Sex. SEEK MEDICAL CARE IF:  You have difficulty urinating.  You have blood in your urine that does not go away after you rest or increase your fluid intake.  You have swelling in your penis or scrotum. SEEK IMMEDIATE MEDICAL CARE IF:   You are suddenly unable to urinate.  You notice blood clots in your urine.  You have chills.  You have a fever.  You have pain in your back or lower abdomen.  You have pain or swelling in your legs. MAKE SURE YOU:   Understand these instructions.  Will watch your condition.  Will get help right away if you are not doing well or get  worse. Document Released: 08/04/2005 Document Revised: 04/28/2012 Document Reviewed: 09/12/2011 [facility] Patient Information 2014 [place].

## 2013-11-30 NOTE — Brief Op Note (Signed)
11/30/2013  9:05 AM  PATIENT:  Scott Hayden  63 y.o. male  PRE-OPERATIVE DIAGNOSIS:  ELEVATED PROSTATIC SPECIFIC ANTIGEN  POST-OPERATIVE DIAGNOSIS:  ELEVATED PROSTATIC SPECIFIC ANTIGEN  PROCEDURE:  Procedure(s): SATURATION BIOPSY TRANSRECTAL ULTRASONIC PROSTATE (TUBP) (N/A)  SURGEON:  Surgeon(s) and Role:    * Alexis Frock, MD - Primary  PHYSICIAN ASSISTANT:   ASSISTANTS: none   ANESTHESIA:   local and general  EBL:  Total I/O In: 100 [I.V.:100] Out: -   BLOOD ADMINISTERED:none  DRAINS: none   LOCAL MEDICATIONS USED:  MARCAINE     SPECIMEN:  Source of Specimen:  14 separate prostate biopsy speciemens, 2 cores each as per separate pathology labels  DISPOSITION OF SPECIMEN:  PATHOLOGY  COUNTS:  YES  TOURNIQUET:  * No tourniquets in log *  DICTATION: .Other Dictation: Dictation Number [id number]  PLAN OF CARE: Discharge to home after PACU  PATIENT DISPOSITION:  PACU - hemodynamically stable.   Delay start of Pharmacological VTE agent (>24hrs) due to surgical blood loss or risk of bleeding: yes

## 2013-11-30 NOTE — Transfer of Care (Signed)
Immediate Anesthesia Transfer of Care Note  Patient: Scott Hayden  Procedure(s) Performed: Procedure(s) (LRB): SATURATION BIOPSY TRANSRECTAL ULTRASONIC PROSTATE (TUBP) (N/A)  Patient Location: PACU  Anesthesia Type: General  Level of Consciousness: awake, sedated, patient cooperative and responds to stimulation  Airway & Oxygen Therapy: Patient Spontanous Breathing and Patient connected to face mask oxygen  Post-op Assessment: Report given to PACU RN, Post -op Vital signs reviewed and stable and Patient moving all extremities  Post vital signs: Reviewed and stable  Complications: No apparent anesthesia complications

## 2013-12-01 ENCOUNTER — Encounter (HOSPITAL_BASED_OUTPATIENT_CLINIC_OR_DEPARTMENT_OTHER): Payer: Self-pay | Admitting: Urology

## 2013-12-01 DIAGNOSIS — I10 Essential (primary) hypertension: Secondary | ICD-10-CM | POA: Diagnosis not present

## 2013-12-01 NOTE — Op Note (Signed)
NAMECAMARION, WEIER NO.:  [id number]  MEDICAL RECORD NO.:  [id number]  LOCATION:                                 FACILITY:  PHYSICIAN:  Alexis Frock, MD     DATE OF BIRTH:  04/15/1951  DATE OF PROCEDURE: 11/30/2013 DATE OF DISCHARGE:                              OPERATIVE REPORT   DIAGNOSIS:  Elevated PSA.  PROCEDURES: 1. Saturation prostate biopsy. 2. Transrectal ultrasound with interpretation.  ESTIMATED BLOOD LOSS:  Nil.  COMPLICATIONS:  None.  SPECIMEN:  Two cores each sample as follows; right anterior transperineal approach, left anterior transperineal approach, right base lateral, right midlateral, right apex lateral, right mid-base, right mid mid, right mid-apex, left base lateral, left mid lateral, left apex lateral, left mid base, left mid mid, left mid-apex.  All for permanent pathology.  FINDINGS:  Transrectal ultrasound volume 70 mL with small-to-moderate median lobe.  No obvious hypoechoic areas.  INDICATION:  Mr. Fedak is a very pleasant 63 year old gentleman with unfortunate history of advanced colon cancer, who has been disease free for quite some time.  He also has history of elevated PSA and has been followed in another state for this previously, but has now moved to [place] within the past year or so.  His PSA unfortunately has continued to trend up.  He has negative biopsies from several years ago. He underwent prostate MRI that revealed no focal lesions.  He was placed on finasteride to help reduce any inflammatory component and unfortunately, his corrected PSA continued to rise.  Options were discussed including repeat  prostate biopsy in the office setting and with the operative setting affording slightly different technical advantages including transperineal anterior approach and more cores of saturation, and he wished to proceed with the latter.  Informed consent was obtained and placed in medical  record.  PROCEDURE IN DETAIL:  The patient being, Scott Hayden, was verified. Procedure being saturation prostate biopsy was confirmed.  Procedure was carried out.  Time-out was performed.  Intravenous antibiotics were administered.  General LMA anesthesia was introduced.  The patient was placed into a medium lithotomy position.  Sequential compression devices were applied.  Transrectal ultrasound was placed per rectum and the prostate was carefully inspected.  Transrectal ultrasound revealed no focal hypoechoic lesions.  There was a mild-to-moderate median lobe.  Volumetric analysis revealed 70 g prostate.  Next, a transperineal approach was taken for anterior cores, two on the right, two on the left.  Following this, a standard transrectal 12 site biopsy was performed using 12 separate bottles, but two cores each bottle representing two cores from each of the sites listed as per above.  Immediately prior to biopsying, 10 mL of Marcaine were equally distributed to the presumed areas of the prostatic nerve pedicles at the apex and base right and lateral respectively.  Volumes maneuver was hemodynamically stable.  There was excellent hemostasis. The perineal sites were clean and a tiny amount of Dermabond was applied to each one of them.  Procedure was terminated.  The patient tolerated the procedure well.  There were no immediate periprocedural complications.  The patient was taken to the postanesthesia care unit  in stable condition.          ______________________________ Alexis Frock, MD     TM/MEDQ  D:  11/30/2013  T:  11/30/2013  Job:  [id number]

## 2013-12-07 DIAGNOSIS — C61 Malignant neoplasm of prostate: Secondary | ICD-10-CM | POA: Diagnosis not present

## 2013-12-07 DIAGNOSIS — I10 Essential (primary) hypertension: Secondary | ICD-10-CM | POA: Diagnosis not present

## 2013-12-07 DIAGNOSIS — R799 Abnormal finding of blood chemistry, unspecified: Secondary | ICD-10-CM | POA: Diagnosis not present

## 2013-12-07 DIAGNOSIS — Z85038 Personal history of other malignant neoplasm of large intestine: Secondary | ICD-10-CM | POA: Diagnosis not present

## 2013-12-13 ENCOUNTER — Other Ambulatory Visit: Payer: Self-pay | Admitting: Urology

## 2013-12-13 DIAGNOSIS — C61 Malignant neoplasm of prostate: Secondary | ICD-10-CM | POA: Diagnosis not present

## 2013-12-13 DIAGNOSIS — R310 Gross hematuria: Secondary | ICD-10-CM | POA: Diagnosis not present

## 2013-12-13 DIAGNOSIS — C649 Malignant neoplasm of unspecified kidney, except renal pelvis: Secondary | ICD-10-CM | POA: Diagnosis not present

## 2014-01-06 ENCOUNTER — Encounter (HOSPITAL_COMMUNITY): Payer: Self-pay | Admitting: Pharmacy Technician

## 2014-01-10 DIAGNOSIS — M6281 Muscle weakness (generalized): Secondary | ICD-10-CM | POA: Diagnosis not present

## 2014-01-10 DIAGNOSIS — C61 Malignant neoplasm of prostate: Secondary | ICD-10-CM | POA: Diagnosis not present

## 2014-01-10 DIAGNOSIS — R279 Unspecified lack of coordination: Secondary | ICD-10-CM | POA: Diagnosis not present

## 2014-01-10 DIAGNOSIS — M62838 Other muscle spasm: Secondary | ICD-10-CM | POA: Diagnosis not present

## 2014-01-11 NOTE — Patient Instructions (Signed)
Scott Hayden  01/11/2014   Your procedure is scheduled on:  01/27/2014 1150am-335pm  Report to [facility].  Follow the Signs to [facility] at     0950      am  Call this number if you have problems the morning of surgery: [phone]   Remember:   Do not eat food or drink liquids after midnight.   Take these medicines the morning of surgery with A SIP OF WATER:    Do not wear jewelry,   Do not wear lotions, powders, or perfumes.   . Men may shave face and neck.  Do not bring valuables to the hospital.  Contacts, dentures or bridgework may not be worn into surgery.  Leave suitcase in the car. After surgery it may be brought to your room.  For patients admitted to the hospital, checkout time is 11:00 AM the day of  discharge.          Please read over the following fact sheets that you were given: [facility] - Preparing for Surgery Before surgery, you can play an important role.  Because skin is not sterile, your skin needs to be as free of germs as possible.  You can reduce the number of germs on your skin by washing with CHG (chlorahexidine gluconate) soap before surgery.  CHG is an antiseptic cleaner which kills germs and bonds with the skin to continue killing germs even after washing. Please DO NOT use if you have an allergy to CHG or antibacterial soaps.  If your skin becomes reddened/irritated stop using the CHG and inform your nurse when you arrive at Short Stay. Do not shave (including legs and underarms) for at least 48 hours prior to the first CHG shower.  You may shave your face/neck. Please follow these instructions carefully:  1.  Shower with CHG Soap the night before surgery and the  morning of Surgery.  2.  If you choose to wash your hair, wash your hair first as usual with your  normal  shampoo.  3.  After you shampoo, rinse your hair and body thoroughly to remove the  shampoo.                           4.  Use CHG as you would any other liquid soap.   You can apply chg directly  to the skin and wash                       Gently with a scrungie or clean washcloth.  5.  Apply the CHG Soap to your body ONLY FROM THE NECK DOWN.   Do not use on face/ open                           Wound or open sores. Avoid contact with eyes, ears mouth and genitals (private parts).                       Wash face,  Genitals (private parts) with your normal soap.             6.  Wash thoroughly, paying special attention to the area where your surgery  will be performed.  7.  Thoroughly rinse your body with warm water from the neck down.  8.  DO NOT shower/wash with your normal soap after using and rinsing  off  the CHG Soap.                9.  Pat yourself dry with a clean towel.            10.  Wear clean pajamas.            11.  Place clean sheets on your bed the night of your first shower and do not  sleep with pets. Day of Surgery : Do not apply any lotions/deodorants the morning of surgery.  Please wear clean clothes to the hospital/surgery center.  FAILURE TO FOLLOW THESE INSTRUCTIONS MAY RESULT IN THE CANCELLATION OF YOUR SURGERY PATIENT SIGNATURE_________________________________  NURSE SIGNATURE__________________________________  ________________________________________________________________________ , coughing and deep breathing exercises, leg exercises

## 2014-01-13 ENCOUNTER — Encounter (HOSPITAL_COMMUNITY)
Admission: RE | Admit: 2014-01-13 | Discharge: 2014-01-13 | Disposition: A | Discharge status: Home / Self Care | Payer: Medicare Other | Source: Ambulatory Visit | Attending: Urology | Admitting: Urology

## 2014-01-13 ENCOUNTER — Encounter (HOSPITAL_COMMUNITY): Payer: Self-pay

## 2014-01-13 DIAGNOSIS — Z01812 Encounter for preprocedural laboratory examination: Secondary | ICD-10-CM | POA: Diagnosis not present

## 2014-01-13 HISTORY — DX: Pneumonia, unspecified organism: J18.9

## 2014-01-13 HISTORY — DX: Myoneural disorder, unspecified: G70.9

## 2014-01-13 HISTORY — DX: Malignant (primary) neoplasm, unspecified: C80.1

## 2014-01-13 HISTORY — DX: Cardiac murmur, unspecified: R01.1

## 2014-01-13 LAB — CBC
HEMATOCRIT: 42.7 % (ref 39.0–52.0)
Hemoglobin: 14.8 g/dL (ref 13.0–17.0)
MCH: 31.1 pg (ref 26.0–34.0)
MCHC: 34.7 g/dL (ref 30.0–36.0)
MCV: 89.7 fL (ref 78.0–100.0)
Platelets: 254 10*3/uL (ref 150–400)
RBC: 4.76 MIL/uL (ref 4.22–5.81)
RDW: 13.1 % (ref 11.5–15.5)
WBC: 8.3 10*3/uL (ref 4.0–10.5)

## 2014-01-13 LAB — BASIC METABOLIC PANEL
BUN: 15 mg/dL (ref 6–23)
CO2: 29 mEq/L (ref 19–32)
CREATININE: 1.06 mg/dL (ref 0.50–1.35)
Calcium: 10.1 mg/dL (ref 8.4–10.5)
Chloride: 98 mEq/L (ref 96–112)
GFR calc Af Amer: 85 mL/min — ABNORMAL LOW (ref >90)
GFR, EST NON AFRICAN AMERICAN: 73 mL/min — AB (ref >90)
GLUCOSE: 79 mg/dL (ref 70–99)
Potassium: 3.5 mEq/L — ABNORMAL LOW (ref 3.7–5.3)
Sodium: 140 mEq/L (ref 137–147)

## 2014-01-26 DIAGNOSIS — C61 Malignant neoplasm of prostate: Secondary | ICD-10-CM | POA: Diagnosis not present

## 2014-01-26 DIAGNOSIS — Z85528 Personal history of other malignant neoplasm of kidney: Secondary | ICD-10-CM | POA: Diagnosis not present

## 2014-01-26 DIAGNOSIS — Z85038 Personal history of other malignant neoplasm of large intestine: Secondary | ICD-10-CM | POA: Diagnosis not present

## 2014-01-26 MED ORDER — DEXTROSE 5 % IV SOLN
3.0000 g | INTRAVENOUS | Status: AC
  Administered 2014-01-27: 3 g via INTRAVENOUS
  Filled 2014-01-26 (×2): qty 3000

## 2014-01-27 ENCOUNTER — Encounter (HOSPITAL_COMMUNITY)
Admission: RE | Disposition: A | Discharge status: Home / Self Care | Payer: Self-pay | Source: Ambulatory Visit | Attending: Urology

## 2014-01-27 ENCOUNTER — Encounter (HOSPITAL_COMMUNITY): Payer: Self-pay

## 2014-01-27 ENCOUNTER — Inpatient Hospital Stay (HOSPITAL_COMMUNITY)
Admission: RE | Admit: 2014-01-27 | Discharge: 2014-01-28 | DRG: 708 | Disposition: A | Discharge status: Home / Self Care | Payer: Medicare Other | Source: Ambulatory Visit | Attending: Urology | Admitting: Urology

## 2014-01-27 ENCOUNTER — Inpatient Hospital Stay (HOSPITAL_COMMUNITY): Payer: Medicare Other | Admitting: Registered Nurse

## 2014-01-27 ENCOUNTER — Encounter (HOSPITAL_COMMUNITY): Payer: Medicare Other | Admitting: Registered Nurse

## 2014-01-27 DIAGNOSIS — T451X5A Adverse effect of antineoplastic and immunosuppressive drugs, initial encounter: Secondary | ICD-10-CM | POA: Diagnosis present

## 2014-01-27 DIAGNOSIS — N529 Male erectile dysfunction, unspecified: Secondary | ICD-10-CM | POA: Diagnosis present

## 2014-01-27 DIAGNOSIS — Z8546 Personal history of malignant neoplasm of prostate: Secondary | ICD-10-CM | POA: Diagnosis present

## 2014-01-27 DIAGNOSIS — Z860100 Personal history of colon polyps, unspecified: Secondary | ICD-10-CM

## 2014-01-27 DIAGNOSIS — R011 Cardiac murmur, unspecified: Secondary | ICD-10-CM | POA: Diagnosis present

## 2014-01-27 DIAGNOSIS — Z8042 Family history of malignant neoplasm of prostate: Secondary | ICD-10-CM | POA: Diagnosis not present

## 2014-01-27 DIAGNOSIS — Z807 Family history of other malignant neoplasms of lymphoid, hematopoietic and related tissues: Secondary | ICD-10-CM | POA: Diagnosis not present

## 2014-01-27 DIAGNOSIS — Z01812 Encounter for preprocedural laboratory examination: Secondary | ICD-10-CM

## 2014-01-27 DIAGNOSIS — I868 Varicose veins of other specified sites: Secondary | ICD-10-CM | POA: Diagnosis present

## 2014-01-27 DIAGNOSIS — Z9849 Cataract extraction status, unspecified eye: Secondary | ICD-10-CM | POA: Diagnosis not present

## 2014-01-27 DIAGNOSIS — G4733 Obstructive sleep apnea (adult) (pediatric): Secondary | ICD-10-CM | POA: Diagnosis present

## 2014-01-27 DIAGNOSIS — Z91041 Radiographic dye allergy status: Secondary | ICD-10-CM | POA: Diagnosis not present

## 2014-01-27 DIAGNOSIS — H409 Unspecified glaucoma: Secondary | ICD-10-CM | POA: Diagnosis present

## 2014-01-27 DIAGNOSIS — K219 Gastro-esophageal reflux disease without esophagitis: Secondary | ICD-10-CM | POA: Diagnosis present

## 2014-01-27 DIAGNOSIS — Z800 Family history of malignant neoplasm of digestive organs: Secondary | ICD-10-CM

## 2014-01-27 DIAGNOSIS — Z85528 Personal history of other malignant neoplasm of kidney: Secondary | ICD-10-CM | POA: Diagnosis not present

## 2014-01-27 DIAGNOSIS — N400 Enlarged prostate without lower urinary tract symptoms: Secondary | ICD-10-CM | POA: Diagnosis not present

## 2014-01-27 DIAGNOSIS — Z85038 Personal history of other malignant neoplasm of large intestine: Secondary | ICD-10-CM | POA: Diagnosis not present

## 2014-01-27 DIAGNOSIS — E785 Hyperlipidemia, unspecified: Secondary | ICD-10-CM | POA: Diagnosis present

## 2014-01-27 DIAGNOSIS — G620 Drug-induced polyneuropathy: Secondary | ICD-10-CM | POA: Diagnosis present

## 2014-01-27 DIAGNOSIS — D360 Benign neoplasm of lymph nodes: Secondary | ICD-10-CM | POA: Diagnosis not present

## 2014-01-27 DIAGNOSIS — C61 Malignant neoplasm of prostate: Secondary | ICD-10-CM | POA: Diagnosis not present

## 2014-01-27 DIAGNOSIS — I10 Essential (primary) hypertension: Secondary | ICD-10-CM | POA: Diagnosis present

## 2014-01-27 DIAGNOSIS — K660 Peritoneal adhesions (postprocedural) (postinfection): Secondary | ICD-10-CM | POA: Diagnosis not present

## 2014-01-27 DIAGNOSIS — Z91013 Allergy to seafood: Secondary | ICD-10-CM | POA: Diagnosis not present

## 2014-01-27 HISTORY — PX: LYMPHADENECTOMY: SHX5960

## 2014-01-27 HISTORY — PX: ROBOT ASSISTED LAPAROSCOPIC RADICAL PROSTATECTOMY: SHX5141

## 2014-01-27 HISTORY — DX: Personal history of malignant neoplasm of prostate: Z85.46

## 2014-01-27 LAB — HEMOGLOBIN AND HEMATOCRIT, BLOOD
HCT: 38.3 % — ABNORMAL LOW (ref 39.0–52.0)
HCT: 37.1 % — ABNORMAL LOW (ref 39.0–52.0)
HEMOGLOBIN: 13.4 g/dL (ref 13.0–17.0)
Hemoglobin: 12.9 g/dL — ABNORMAL LOW (ref 13.0–17.0)

## 2014-01-27 LAB — TYPE AND SCREEN
ABO/RH(D): A POS
Antibody Screen: NEGATIVE

## 2014-01-27 LAB — ABO/RH: ABO/RH(D): A POS

## 2014-01-27 SURGERY — ROBOTIC ASSISTED LAPAROSCOPIC RADICAL PROSTATECTOMY
Anesthesia: General

## 2014-01-27 MED ORDER — SUFENTANIL CITRATE 50 MCG/ML IV SOLN
INTRAVENOUS | Status: AC
  Filled 2014-01-27: qty 1

## 2014-01-27 MED ORDER — MEPERIDINE HCL 50 MG/ML IJ SOLN: 6.2500 mg | INTRAMUSCULAR | Status: DC | PRN

## 2014-01-27 MED ORDER — SUCCINYLCHOLINE CHLORIDE 20 MG/ML IJ SOLN
INTRAMUSCULAR | Status: DC | PRN
  Administered 2014-01-27: 80 mg via INTRAVENOUS
  Administered 2014-01-27: 100 mg via INTRAVENOUS

## 2014-01-27 MED ORDER — ROCURONIUM BROMIDE 100 MG/10ML IV SOLN
INTRAVENOUS | Status: DC | PRN
  Administered 2014-01-27 (×2): 10 mg via INTRAVENOUS
  Administered 2014-01-27: 20 mg via INTRAVENOUS
  Administered 2014-01-27: 5 mg via INTRAVENOUS
  Administered 2014-01-27: 10 mg via INTRAVENOUS
  Administered 2014-01-27: 45 mg via INTRAVENOUS
  Administered 2014-01-27: 5 mg via INTRAVENOUS

## 2014-01-27 MED ORDER — HYDROMORPHONE HCL PF 1 MG/ML IJ SOLN
0.2500 mg | INTRAMUSCULAR | Status: DC | PRN
  Administered 2014-01-27: 0.25 mg via INTRAVENOUS

## 2014-01-27 MED ORDER — ONDANSETRON HCL 4 MG/2ML IJ SOLN
INTRAMUSCULAR | Status: AC
Start: 2014-01-27 — End: 2014-01-27
  Filled 2014-01-27: qty 2

## 2014-01-27 MED ORDER — KETOROLAC TROMETHAMINE 30 MG/ML IJ SOLN
INTRAMUSCULAR | Status: AC
  Filled 2014-01-27: qty 1

## 2014-01-27 MED ORDER — EPHEDRINE SULFATE 50 MG/ML IJ SOLN
INTRAMUSCULAR | Status: DC | PRN
  Administered 2014-01-27 (×3): 10 mg via INTRAVENOUS

## 2014-01-27 MED ORDER — LATANOPROST 0.005 % OP SOLN
1.0000 [drp] | Freq: Every day | OPHTHALMIC | Status: DC
  Administered 2014-01-27: 1 [drp] via OPHTHALMIC
  Filled 2014-01-27: qty 2.5

## 2014-01-27 MED ORDER — ONDANSETRON HCL 4 MG/2ML IJ SOLN
INTRAMUSCULAR | Status: DC | PRN
  Administered 2014-01-27: 2 mg via INTRAVENOUS

## 2014-01-27 MED ORDER — ACETAMINOPHEN 500 MG PO TABS
1000.0000 mg | ORAL_TABLET | Freq: Three times a day (TID) | ORAL | Status: DC
  Administered 2014-01-27 – 2014-01-28 (×2): 1000 mg via ORAL
  Filled 2014-01-27 (×2): qty 2

## 2014-01-27 MED ORDER — SODIUM CHLORIDE 0.9 % IV BOLUS (SEPSIS): 1000.0000 mL | Freq: Once | INTRAVENOUS | Status: DC

## 2014-01-27 MED ORDER — MIDAZOLAM HCL 2 MG/2ML IJ SOLN
INTRAMUSCULAR | Status: AC
Start: 2014-01-27 — End: 2014-01-27
  Filled 2014-01-27: qty 2

## 2014-01-27 MED ORDER — HYDROMORPHONE HCL PF 1 MG/ML IJ SOLN
INTRAMUSCULAR | Status: AC
  Filled 2014-01-27: qty 1

## 2014-01-27 MED ORDER — STERILE WATER FOR IRRIGATION IR SOLN
Status: DC | PRN
  Administered 2014-01-27: 1500 mL

## 2014-01-27 MED ORDER — PROMETHAZINE HCL 25 MG/ML IJ SOLN: 6.2500 mg | INTRAMUSCULAR | Status: DC | PRN

## 2014-01-27 MED ORDER — HYDROCODONE-ACETAMINOPHEN 5-325 MG PO TABS: 1.0000 | ORAL_TABLET | Freq: Four times a day (QID) | ORAL | Status: DC | PRN

## 2014-01-27 MED ORDER — LACTATED RINGERS IV SOLN
INTRAVENOUS | Status: DC
  Administered 2014-01-27: 16:00:00 via INTRAVENOUS
  Administered 2014-01-27: 1000 mL via INTRAVENOUS

## 2014-01-27 MED ORDER — HYDROMORPHONE HCL PF 1 MG/ML IJ SOLN: 0.5000 mg | INTRAMUSCULAR | Status: DC | PRN

## 2014-01-27 MED ORDER — PHENYLEPHRINE HCL 10 MG/ML IJ SOLN
INTRAMUSCULAR | Status: DC | PRN
  Administered 2014-01-27 (×2): 120 ug via INTRAVENOUS

## 2014-01-27 MED ORDER — KETOROLAC TROMETHAMINE 30 MG/ML IJ SOLN
30.0000 mg | Freq: Four times a day (QID) | INTRAMUSCULAR | Status: DC
  Administered 2014-01-27 – 2014-01-28 (×3): 30 mg via INTRAVENOUS
  Filled 2014-01-27 (×5): qty 1

## 2014-01-27 MED ORDER — ACETAMINOPHEN 325 MG PO TABS: 650.0000 mg | ORAL_TABLET | ORAL | Status: DC | PRN

## 2014-01-27 MED ORDER — METOPROLOL SUCCINATE ER 100 MG PO TB24
100.0000 mg | ORAL_TABLET | Freq: Every morning | ORAL | Status: DC
  Administered 2014-01-28: 100 mg via ORAL
  Filled 2014-01-27: qty 1

## 2014-01-27 MED ORDER — ONDANSETRON HCL 4 MG/2ML IJ SOLN: 4.0000 mg | INTRAMUSCULAR | Status: DC | PRN

## 2014-01-27 MED ORDER — DEXTROSE-NACL 5-0.45 % IV SOLN: INTRAVENOUS | Status: DC

## 2014-01-27 MED ORDER — LIDOCAINE HCL (CARDIAC) 20 MG/ML IV SOLN
INTRAVENOUS | Status: DC | PRN
  Administered 2014-01-27: 25 mg via INTRATRACHEAL
  Administered 2014-01-27: 75 mg via INTRAVENOUS

## 2014-01-27 MED ORDER — SODIUM CHLORIDE 0.9 % IV BOLUS (SEPSIS)
1000.0000 mL | Freq: Once | INTRAVENOUS | Status: AC
  Administered 2014-01-27: 1000 mL via INTRAVENOUS

## 2014-01-27 MED ORDER — CIPROFLOXACIN HCL 500 MG PO TABS: 500.0000 mg | ORAL_TABLET | Freq: Two times a day (BID) | ORAL | Status: DC

## 2014-01-27 MED ORDER — BUPIVACAINE LIPOSOME 1.3 % IJ SUSP
INTRAMUSCULAR | Status: DC | PRN
  Administered 2014-01-27: 20 mL

## 2014-01-27 MED ORDER — HYDROCODONE-ACETAMINOPHEN 5-325 MG PO TABS: 1.0000 | ORAL_TABLET | ORAL | Status: DC | PRN

## 2014-01-27 MED ORDER — KCL IN DEXTROSE-NACL 20-5-0.45 MEQ/L-%-% IV SOLN
INTRAVENOUS | Status: DC
Start: 2014-01-27 — End: 2014-01-28
  Administered 2014-01-27: 1000 mL via INTRAVENOUS
  Filled 2014-01-27 (×2): qty 1000

## 2014-01-27 MED ORDER — KCL IN DEXTROSE-NACL 20-5-0.45 MEQ/L-%-% IV SOLN
INTRAVENOUS | Status: AC
  Filled 2014-01-27: qty 1000

## 2014-01-27 MED ORDER — CEFAZOLIN SODIUM 10 G IJ SOLR
3.0000 g | Freq: Once | INTRAMUSCULAR | Status: AC
  Administered 2014-01-27: 3 g via INTRAVENOUS
  Filled 2014-01-27: qty 3000

## 2014-01-27 MED ORDER — SODIUM CHLORIDE 0.9 % IJ SOLN
INTRAMUSCULAR | Status: AC
  Filled 2014-01-27: qty 20

## 2014-01-27 MED ORDER — ACETAMINOPHEN 10 MG/ML IV SOLN
1000.0000 mg | Freq: Once | INTRAVENOUS | Status: AC
  Administered 2014-01-27: 1000 mg via INTRAVENOUS
  Filled 2014-01-27: qty 100

## 2014-01-27 MED ORDER — GLYCOPYRROLATE 0.2 MG/ML IJ SOLN
INTRAMUSCULAR | Status: DC | PRN
  Administered 2014-01-27: 0.2 mg via INTRAVENOUS
  Administered 2014-01-27: 0.6 mg via INTRAVENOUS

## 2014-01-27 MED ORDER — PROPOFOL 10 MG/ML IV BOLUS
INTRAVENOUS | Status: AC
  Filled 2014-01-27: qty 20

## 2014-01-27 MED ORDER — GLYCOPYRROLATE 0.2 MG/ML IJ SOLN
INTRAMUSCULAR | Status: AC
  Filled 2014-01-27: qty 3

## 2014-01-27 MED ORDER — ROCURONIUM BROMIDE 100 MG/10ML IV SOLN
INTRAVENOUS | Status: AC
Start: 2014-01-27 — End: 2014-01-27
  Filled 2014-01-27: qty 1

## 2014-01-27 MED ORDER — BUPIVACAINE LIPOSOME 1.3 % IJ SUSP
20.0000 mL | Freq: Once | INTRAMUSCULAR | Status: DC
  Filled 2014-01-27: qty 20

## 2014-01-27 MED ORDER — OXYCODONE HCL 5 MG PO TABS: 5.0000 mg | ORAL_TABLET | ORAL | Status: DC | PRN

## 2014-01-27 MED ORDER — LACTATED RINGERS IR SOLN
Status: DC | PRN
  Administered 2014-01-27: 1000 mL

## 2014-01-27 MED ORDER — SENNA 8.6 MG PO TABS
1.0000 | ORAL_TABLET | Freq: Two times a day (BID) | ORAL | Status: DC
  Administered 2014-01-27: 8.6 mg via ORAL
  Filled 2014-01-27: qty 1

## 2014-01-27 MED ORDER — DEXAMETHASONE SODIUM PHOSPHATE 10 MG/ML IJ SOLN
INTRAMUSCULAR | Status: AC
Start: 2014-01-27 — End: 2014-01-27
  Filled 2014-01-27: qty 1

## 2014-01-27 MED ORDER — ROCURONIUM BROMIDE 100 MG/10ML IV SOLN
INTRAVENOUS | Status: AC
  Filled 2014-01-27: qty 1

## 2014-01-27 MED ORDER — PROPOFOL 10 MG/ML IV BOLUS
INTRAVENOUS | Status: DC | PRN
  Administered 2014-01-27: 200 mg via INTRAVENOUS
  Administered 2014-01-27: 100 mg via INTRAVENOUS

## 2014-01-27 MED ORDER — DOCUSATE SODIUM 100 MG PO CAPS
100.0000 mg | ORAL_CAPSULE | Freq: Two times a day (BID) | ORAL | Status: DC
  Administered 2014-01-27 – 2014-01-28 (×2): 100 mg via ORAL
  Filled 2014-01-27 (×3): qty 1

## 2014-01-27 MED ORDER — FENOFIBRATE 160 MG PO TABS
160.0000 mg | ORAL_TABLET | Freq: Every day | ORAL | Status: DC
  Administered 2014-01-28: 160 mg via ORAL
  Filled 2014-01-27: qty 1

## 2014-01-27 MED ORDER — LIDOCAINE HCL (CARDIAC) 20 MG/ML IV SOLN
INTRAVENOUS | Status: AC
  Filled 2014-01-27: qty 5

## 2014-01-27 MED ORDER — MORPHINE SULFATE 2 MG/ML IJ SOLN: 2.0000 mg | INTRAMUSCULAR | Status: DC | PRN

## 2014-01-27 MED ORDER — SUFENTANIL CITRATE 50 MCG/ML IV SOLN
INTRAVENOUS | Status: DC | PRN
  Administered 2014-01-27: 10 ug via INTRAVENOUS
  Administered 2014-01-27 (×4): 5 ug via INTRAVENOUS
  Administered 2014-01-27: 10 ug via INTRAVENOUS
  Administered 2014-01-27 (×5): 5 ug via INTRAVENOUS

## 2014-01-27 MED ORDER — PANTOPRAZOLE SODIUM 40 MG PO TBEC
80.0000 mg | DELAYED_RELEASE_TABLET | Freq: Every day | ORAL | Status: DC
  Administered 2014-01-28: 80 mg via ORAL
  Filled 2014-01-27: qty 2

## 2014-01-27 MED ORDER — LACTATED RINGERS IV SOLN: INTRAVENOUS | Status: DC

## 2014-01-27 MED ORDER — MIDAZOLAM HCL 5 MG/5ML IJ SOLN
INTRAMUSCULAR | Status: DC | PRN
  Administered 2014-01-27: 2 mg via INTRAVENOUS

## 2014-01-27 MED ORDER — NEOSTIGMINE METHYLSULFATE 10 MG/10ML IV SOLN
INTRAVENOUS | Status: DC | PRN
  Administered 2014-01-27: 5 mg via INTRAVENOUS

## 2014-01-27 MED ORDER — HYDROCHLOROTHIAZIDE 12.5 MG PO CAPS
12.5000 mg | ORAL_CAPSULE | Freq: Every morning | ORAL | Status: DC
  Administered 2014-01-28: 12.5 mg via ORAL
  Filled 2014-01-27: qty 1

## 2014-01-27 MED ORDER — GLYCOPYRROLATE 0.2 MG/ML IJ SOLN
INTRAMUSCULAR | Status: AC
  Filled 2014-01-27: qty 1

## 2014-01-27 SURGICAL SUPPLY — 60 items
BAG URINE DRAINAGE (UROLOGICAL SUPPLIES) ×3 IMPLANT
CABLE HIGH FREQUENCY MONO STRZ (ELECTRODE) ×3 IMPLANT
CANISTER SUCTION 2500CC (MISCELLANEOUS) IMPLANT
CATH FOLEY 2WAY SLVR 18FR 30CC (CATHETERS) ×3 IMPLANT
CATH TIEMANN FOLEY 18FR 5CC (CATHETERS) ×3 IMPLANT
CHLORAPREP W/TINT 26ML (MISCELLANEOUS) ×3 IMPLANT
CLIP LIGATING HEM O LOK PURPLE (MISCELLANEOUS) ×12 IMPLANT
CLIP LIGATING HEMO LOK XL GOLD (MISCELLANEOUS) IMPLANT
CLOTH BEACON ORANGE TIMEOUT ST (SAFETY) ×3 IMPLANT
COVER SURGICAL LIGHT HANDLE (MISCELLANEOUS) ×3 IMPLANT
COVER TIP SHEARS 8 DVNC (MISCELLANEOUS) ×2 IMPLANT
COVER TIP SHEARS 8MM DA VINCI (MISCELLANEOUS) ×1
CUTTER ECHEON FLEX ENDO 45 340 (ENDOMECHANICALS) ×3 IMPLANT
DECANTER SPIKE VIAL GLASS SM (MISCELLANEOUS) IMPLANT
DERMABOND ADVANCED (GAUZE/BANDAGES/DRESSINGS) ×1
DERMABOND ADVANCED .7 DNX12 (GAUZE/BANDAGES/DRESSINGS) ×2 IMPLANT
DRAPE ARM DVNC X/XI (DISPOSABLE) ×8 IMPLANT
DRAPE COLUMN DVNC XI (DISPOSABLE) ×2 IMPLANT
DRAPE DA VINCI XI ARM (DISPOSABLE) ×4
DRAPE DA VINCI XI COLUMN (DISPOSABLE) ×1
DRAPE SURG IRRIG POUCH 19X23 (DRAPES) ×3 IMPLANT
DRSG TEGADERM 4X4.75 (GAUZE/BANDAGES/DRESSINGS) ×6 IMPLANT
DRSG TEGADERM 6X8 (GAUZE/BANDAGES/DRESSINGS) ×6 IMPLANT
ELECT REM PT RETURN 9FT ADLT (ELECTROSURGICAL) ×3
ELECTRODE REM PT RTRN 9FT ADLT (ELECTROSURGICAL) ×2 IMPLANT
GAUZE SPONGE 2X2 8PLY STRL LF (GAUZE/BANDAGES/DRESSINGS) ×2 IMPLANT
GLOVE BIO SURGEON STRL SZ 6.5 (GLOVE) ×3 IMPLANT
GLOVE BIOGEL M STRL SZ7.5 (GLOVE) ×6 IMPLANT
GLOVE BIOGEL PI IND STRL 7.5 (GLOVE) ×2 IMPLANT
GLOVE BIOGEL PI INDICATOR 7.5 (GLOVE) ×1
GOWN STRL REUS W/TWL LRG LVL4 (GOWN DISPOSABLE) ×9 IMPLANT
HOLDER FOLEY CATH W/STRAP (MISCELLANEOUS) ×3 IMPLANT
IV LACTATED RINGERS 1000ML (IV SOLUTION) IMPLANT
KIT ACCESSORY DA VINCI DISP (KITS) ×1
KIT ACCESSORY DVNC DISP (KITS) ×2 IMPLANT
KIT PROCEDURE DA VINCI SI (MISCELLANEOUS) ×1
KIT PROCEDURE DVNC SI (MISCELLANEOUS) ×2 IMPLANT
NEEDLE INSUFFLATION 14GA 120MM (NEEDLE) ×3 IMPLANT
NEEDLE SPNL 22GX7 SPINOC (NEEDLE) ×3 IMPLANT
PACK ROBOT UROLOGY CUSTOM (CUSTOM PROCEDURE TRAY) ×3 IMPLANT
RELOAD GREEN ECHELON 45 (STAPLE) ×6 IMPLANT
SCISSORS LAP 5X45 EPIX DISP (ENDOMECHANICALS) ×3 IMPLANT
SEAL CANN UNIV 5-8 DVNC XI (MISCELLANEOUS) ×6 IMPLANT
SEAL XI 5MM-8MM UNIVERSAL (MISCELLANEOUS) ×3
SET IRRIG TUBING LAPAROSCOPIC (IRRIGATION / IRRIGATOR) ×3 IMPLANT
SET TUBE IRRIG SUCTION NO TIP (IRRIGATION / IRRIGATOR) IMPLANT
SOLUTION ELECTROLUBE (MISCELLANEOUS) ×3 IMPLANT
SPONGE GAUZE 2X2 STER 10/PKG (GAUZE/BANDAGES/DRESSINGS) ×1
SPONGE LAP 4X18 X RAY DECT (DISPOSABLE) ×3 IMPLANT
SUT ETHILON 3 0 PS 1 (SUTURE) ×3 IMPLANT
SUT MNCRL AB 4-0 PS2 18 (SUTURE) ×9 IMPLANT
SUT PDS AB 1 CT1 27 (SUTURE) ×6 IMPLANT
SUT VIC AB 2-0 UR6 27 (SUTURE) ×3 IMPLANT
SUT VLOC BARB 180 ABS3/0GR12 (SUTURE) ×9
SUTURE VLOC BRB 180 ABS3/0GR12 (SUTURE) ×6 IMPLANT
SYR 27GX1/2 1ML LL SAFETY (SYRINGE) ×3 IMPLANT
TOWEL OR 17X26 10 PK STRL BLUE (TOWEL DISPOSABLE) ×3 IMPLANT
TOWEL OR NON WOVEN STRL DISP B (DISPOSABLE) ×3 IMPLANT
TROCAR 12M 150ML BLUNT (TROCAR) IMPLANT
WATER STERILE IRR 1500ML POUR (IV SOLUTION) IMPLANT

## 2014-01-27 NOTE — Discharge Instructions (Signed)

## 2014-01-27 NOTE — Progress Notes (Signed)
RT set patient up on CPAP. Patients home setting is 10 cmH2O. Water chamber is filled with sterile water for humidification. RT will continue to monitor as needed.

## 2014-01-27 NOTE — Progress Notes (Signed)
Utilization review completed.  

## 2014-01-27 NOTE — Anesthesia Preprocedure Evaluation (Signed)
Anesthesia Evaluation  Patient identified by MRN, date of birth, ID band Patient awake    Reviewed: Allergy & Precautions, H&P , NPO status , Patient's Chart, lab work & pertinent test results  Airway Mallampati: II TM Distance: >3 FB Neck ROM: Full    Dental no notable dental hx.    Pulmonary sleep apnea and Continuous Positive Airway Pressure Ventilation ,  breath sounds clear to auscultation  Pulmonary exam normal       Cardiovascular hypertension, Pt. on medications Rhythm:Regular Rate:Normal     Neuro/Psych Chemotherapy-induced neuropathy negative psych ROS   GI/Hepatic negative GI ROS, Neg liver ROS,   Endo/Other  negative endocrine ROS  Renal/GU negative Renal ROS  negative genitourinary   Musculoskeletal negative musculoskeletal ROS (+)   Abdominal   Peds negative pediatric ROS (+)  Hematology negative hematology ROS (+)   Anesthesia Other Findings   Reproductive/Obstetrics negative OB ROS                           Anesthesia Physical Anesthesia Plan  ASA: III  Anesthesia Plan: General   Post-op Pain Management:    Induction: Intravenous  Airway Management Planned: Oral ETT  Additional Equipment:   Intra-op Plan:   Post-operative Plan: Extubation in OR  Informed Consent: I have reviewed the patients History and Physical, chart, labs and discussed the procedure including the risks, benefits and alternatives for the proposed anesthesia with the patient or authorized representative who has indicated his/her understanding and acceptance.   Dental advisory given  Plan Discussed with: CRNA  Anesthesia Plan Comments:         Anesthesia Quick Evaluation

## 2014-01-27 NOTE — Transfer of Care (Signed)
Immediate Anesthesia Transfer of Care Note  Patient: Scott Hayden  Procedure(s) Performed: Procedure(s): ROBOTIC ASSISTED LAPAROSCOPIC RADICAL PROSTATECTOMY WITH INDOCYANINE GREEN DYE,EXTENSIVE ADHESIOLYSIS " PRE -PERITONEAL" (N/A) BILATERAL PELVIC LYMPH NODE DISSECTION (Bilateral)  Patient Location: PACU  Anesthesia Type:General  Level of Consciousness: awake, alert , oriented and patient cooperative  Airway & Oxygen Therapy: Patient Spontanous Breathing and Patient connected to face mask oxygen  Post-op Assessment: Report given to PACU RN, Post -op Vital signs reviewed and stable and Patient moving all extremities X 4  Post vital signs: Reviewed and stable  Complications: No apparent anesthesia complications

## 2014-01-27 NOTE — Anesthesia Postprocedure Evaluation (Signed)
  Anesthesia Post-op Note  Patient: Scott Hayden  Procedure(s) Performed: Procedure(s) (LRB): ROBOTIC ASSISTED LAPAROSCOPIC RADICAL PROSTATECTOMY WITH INDOCYANINE GREEN DYE,EXTENSIVE ADHESIOLYSIS " PRE -PERITONEAL" (N/A) BILATERAL PELVIC LYMPH NODE DISSECTION (Bilateral)  Patient Location: PACU  Anesthesia Type: General  Level of Consciousness: awake and alert   Airway and Oxygen Therapy: Patient Spontanous Breathing  Post-op Pain: mild  Post-op Assessment: Post-op Vital signs reviewed, Patient's Cardiovascular Status Stable, Respiratory Function Stable, Patent Airway and No signs of Nausea or vomiting  Last Vitals:  Filed Vitals:   01/27/14 1738  BP: 118/66  Pulse: 79  Temp: 36.6 C  Resp: 14    Post-op Vital Signs: stable   Complications: No apparent anesthesia complications

## 2014-01-27 NOTE — Brief Op Note (Signed)
01/27/2014  4:25 PM  PATIENT:  Scott Hayden  62 y.o. male  PRE-OPERATIVE DIAGNOSIS:  PROSTATE CANCER  POST-OPERATIVE DIAGNOSIS:  PROSTATE CANCER  PROCEDURE:  Procedure(s): ROBOTIC ASSISTED LAPAROSCOPIC RADICAL PROSTATECTOMY WITH INDOCYANINE GREEN DYE,EXTENSIVE ADHESIOLYSIS " PRE -PERITONEAL" (N/A) BILATERAL PELVIC LYMPH NODE DISSECTION (Bilateral)  SURGEON:  Surgeon(s) and Role:    * Alexis Frock, MD - Primary  PHYSICIAN ASSISTANT:   ASSISTANTS: Felipa Furnace, PA   ANESTHESIA:   local and general  EBL:  Total I/O In: -  Out: 40 [Blood:50]  BLOOD ADMINISTERED:none  DRAINS: JP, Foley   LOCAL MEDICATIONS USED:  MARCAINE     SPECIMEN:  Source of Specimen:  pelvic lymph nodes, prostatectomy  DISPOSITION OF SPECIMEN:  PATHOLOGY  COUNTS:  YES  TOURNIQUET:  * No tourniquets in log *  DICTATION: .Other Dictation: Dictation Number [id number]  PLAN OF CARE: Admit to inpatient   PATIENT DISPOSITION:  PACU - hemodynamically stable.   Delay start of Pharmacological VTE agent (>24hrs) due to surgical blood loss or risk of bleeding: yes
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
## 2014-01-28 LAB — BASIC METABOLIC PANEL
BUN: 15 mg/dL (ref 6–23)
CO2: 28 mEq/L (ref 19–32)
Calcium: 8.7 mg/dL (ref 8.4–10.5)
Chloride: 98 mEq/L (ref 96–112)
Creatinine, Ser: 1.25 mg/dL (ref 0.50–1.35)
GFR calc Af Amer: 70 mL/min — ABNORMAL LOW (ref >90)
GFR calc non Af Amer: 60 mL/min — ABNORMAL LOW (ref >90)
GLUCOSE: 114 mg/dL — AB (ref 70–99)
Potassium: 3.3 mEq/L — ABNORMAL LOW (ref 3.7–5.3)
SODIUM: 137 meq/L (ref 137–147)

## 2014-01-28 LAB — HEMOGLOBIN AND HEMATOCRIT, BLOOD
HCT: 34.5 % — ABNORMAL LOW (ref 39.0–52.0)
Hemoglobin: 11.7 g/dL — ABNORMAL LOW (ref 13.0–17.0)

## 2014-01-28 NOTE — Op Note (Signed)
NAMEODESSA, MORREN NO.:  [id number]  MEDICAL RECORD NO.:  [id number]  LOCATION:  5397                         FACILITY:  [facility]  PHYSICIAN:  Alexis Frock, MD     DATE OF BIRTH:  1950/11/30  DATE OF PROCEDURE: 01/27/2014  DATE OF DISCHARGE:                              OPERATIVE REPORT   DIAGNOSIS:  Moderate risk prostate cancer.  PROCEDURES: 1. Robotic-assisted laparoscopic radical prostatectomy. 2. Bilateral pelvic lymphadenectomy. 3. Injection of ICG dye for sentinel lymphangiography. 4. Laparoscopic extensive adhesiolysis.  ASSISTANT:  Leta Baptist, PA  ESTIMATED BLOOD LOSS:  100 mL.  COMPLICATIONS:  None.  SPECIMEN: 1. Right external iliac lymph nodes. 2. Right obturator lymph nodes. 3. Periprostatic fat. 4. Radical prostatectomy. 5. Left external iliac lymph nodes. 6. Left obturator lymph nodes. 7. Left internal iliac lymph node, sentinel.  FINDINGS: 1. Complete obliteration of the space of Retzius due to prior surgery,     preperitoneal to technique not possible. 2. Multiple dense omental adhesions in the low anterior midline as     well as some in the left lower quadrant.  These were completely     Released with laparoscopic adhesiolysis.  DRAINS: 1. Jackson-Pratt drain to bulb suction. 2. Foley catheter to straight drain.  INDICATION:  Scott Hayden is a pleasant 63 year old gentleman who unfortunately has had multiple prior malignancies including stage III colon cancer, status post surgery and chemotherapy as well as kidney cancer, status post partial nephrectomy.  He has no evidence of disease from those malignancies.  He was found on workup of continuously rising PSA to have moderate risk prostate cancer that was clinically localized. Options were discussed including surveillance versus ablative therapies versus surgical extirpation with and without minimally invasive assistance and adamantly wished to proceed with robotic  prostatectomy. We discussed extensively preoperatively the fact that his prior surgery should make any attempt at surgical removal potentially much more difficult and risky, and we discussed tentative plan of a preperitoneal approach with final modality to be of course determined on intraoperative anatomy.  He wished to proceed and informed consent was obtained and placed in the medical record.  PROCEDURE IN DETAIL:  The patient being, Scott Hayden, was verified. Procedure being robotic prostatectomy was confirmed.  Procedure was carried out.  Time-out was performed.  Intravenous antibiotics were administered.  General endotracheal anesthesia was introduced.  The patient was placed into a supine position in steep Trendelenburg after he was further fashioned on the operative table using 3-inch tape over his chest.  His arms were tucked.  Ankles were padded.  Sterile field was created by prepping and draping his infraxiphoid abdomen using chlorhexidine gluconate in his penis, perineum and proximal thighs using iodine x3.  Foley catheter was placed for urethra to straight drain and the bladder was filled with 300 mL of fluid, to displace the bowel superiorly.  Attention was directed at the preperitoneal space development.  Cut-down was performed approximately 12 mm incision in the infraumbilical location down to the fascia, which was incised sharply. Then using the surgeon's finger attempt was made to develop on the space of Retzius to allow placement of balloon and further ports.  With digital examination, the space of Retzius appeared completely obliterated.  There was a very dense tissue in this location worrisome for extensive adhesions in this area despite favorable looking CAT scan preoperatively.  It was felt that balloon dilation in this space given these findings wound not be prudent.  Decision was made to attempt intraperitoneal technique, location of the abdomen in the right  upper quadrant was chosen for insufflation and initial port placement given its location well away from prior surgical sites using Veress technique having passed the aspiration and drop test.  High-flow, low-pressure pneumoperitoneum was obtained, and using a 5-mm laparoscopic trocar with laparoscopic visualization, the port was placed in the subcutaneous fat muscle and fascial layers into the peritoneal cavity.  Laparoscopic examination of the peritoneal cavity revealed significant adhesions in the lower midline as anticipated, these did appear to be omental only. There were also several other adhesions in the left lower quadrant. None of these appeared to be bowel containing and there was no visceral injury noted.  Additional port was then placed with 8-mm robotic port in right paramedian location and via this port, very careful extensive laparoscoic adhesiolysis was performed using sharp scissors, freeing the dense omental adhesions away from the anterior abdominal wall and left lower quadrant dense omental adhesions appeared to be quite vascular and this was taken down using endoscopic stapler.  Inspection of the released area revealed no evidence of visceral injury and no evidence of bowel within the stents adhesions.  Additional ports were then carefully placed as follows; an 8-mm robotic port above the umbilicus in the midline; 8-mm robotic port in paramedian location on the left and 8-mm robotic port in the far lateral left location and 12-mm assistant port in right far lateral location.  Robot was docked and passed through electronic checks.  Initial attention was directed to the development of space of Retzius.  Incision was made lateral to the left medial umbilical ligament from the midline towards the area of the internal ring and coursing along the iliac vessels.  The patient had prior hernia repair.  There were significant adhesions in this location as well, which were  carefully released.  The bladder was swept medially away from pelvic sidewall towards the area of the base of the prostate.  Mirror image dissection was performed on the right side.  An anterior attachments were taken down.  These were also quite dense consistent with obliteration of prior space of Retzius.  None of these contained bowel or bladder.  This exposed the anterior base of the prostate, which was then defatted with the fat being set aside for permanent pathology, labeled periprostatic fat.  Next, a 0.2 mL of indocyanine green dye were injected into the right and left lobe of the prostate with intervening suction to avoid dye spillage and this did not occur.  The endopelvic fascia was then carefully incised bilaterally and the prostate was swept away from the pelvic sidewall.  This exposing the dorsal venous complex, which was controlled using vascular load stapler, taking great care to avoid injury to the membranous urethra and this did not occur.  There had been approximately 15 minutes post-dye injection and the pelvis was irrigated under near-infrared fluorescence light.  IgG lymphangiography revealed no obvious sentinel lymph nodes within the right hemipelvis.  There were several lymphatic channels noted, but did not appear to be corresponding to any lymph nodes.  In the left hemipelvis, there was a single dominant sentinel node that was hyperfluorescent corresponding  to an internal iliac lymph node, but no others in the left hemipelvis, this was found by tracing a hyperfluorescent lymphatic channel from the left prostate base, along the bladder, and into the internal iliac group.  Attention was then directed to lymphadenectomy, first on the right side, all fiber fatty tissue in the confines of the right external iliac vein, iliac artery, pelvic sidewall were carefully mobilized.  Lymphostasis was achieved with cold clips. This set aside, labeled right external iliac lymph  nodes, all fiber fatty tissue in the confines of the right external iliac vein, obturator nerve, and pelvic sidewall then mobilized, set aside, labeled right obturator lymph nodes.  The obturator nerve was inspected following these maneuvers and found to be intact.  A mirror image lymphadenectomy was then performed on the left side again of the left external iliac and obturator lymph nodes respectively.  Hemostasis was achieved with cold clips and the obturator nerve was inspected and found to be intact.  The single dominant sentinel left internal iliac lymph node, which was then carefully dissected free.  Lymphostasis was achieved with cold clips, set aside for permanent pathology.  Attention was then directed at bladder neck dissection and the bladder neck was identified removing Foley catheter back and forth and the bladder neck was separated in anterior-posterior direction from the anterior base of the prostate. Taking great care to avoid excessive bladder neck caliber and this did not occur.  Posterior dissection was performed by incising approximately 7-mm inferior-posterior to posterior lip of the bladder neck and dissecting inferiorly and posteriorly.  The plane of Denonvilliers was entered.  Bilateral vas deferens and seminal vesicles were seen.  Vas deferens was dissected for distance of 4 cm, placed in gentle superior traction as seminal vesicles were dissected towards their tips and also placed on gentle superior traction.  The plane of Denonvilliers was further developed in the base-to-apex orientation, this exposed the vascular pedicles bilaterally.  As the patient had predominantly anterior tumor moderate, aggressiveness nerve sparing was performed bilaterally sweeping presumed neurovascular tissue away from the inferolateral prostate as cold clipping was performed to the prostatic pedicles.  Next, apical dissection was carefully performed.  Membranous urethra transected,  this completely freed up.  The prostatectomy specimen was placed into the EndoCatch bag for later retrieval. Attention was then directed to confirming no rectal injury.  The pelvis was filled with saline via suction irrigator and several pumps of an anally placed Foley catheter, were done with care and no rectal violation was noted.  The pelvis was then emptied.  Attention was directed to posterior reconstruction.  Posterior reconstruction was performed using a single V-Loc suture reapproximating the posterior urethral plate to the posterior bladder neck, bringing the structures into tension-free apposition.  Next, mucosal anastomosis was performed using double-armed V-Loc suture bringing the bladder and membranous urethra into tension-free apposition circumferentially.  New Foley catheter was then placed and irrigated quantitatively and was connected to straight drain.  The anterior reconstruction was performed by anchoring the previous anastomotic stitch to the puboprostatic ligament. Close suction drain was brought through the previous left lateral most assistant port site.  Robot was then undocked.  The right 12-mm assistant port site was closed at the level of the fascia using Eulas Post- Thomason suture passer and 0 Vicryl.  Specimen was retrieved by extending the camera port site for distance approximately 3 fingerbreadths removing the prostatectomy specimen and setting aside for permanent pathology.  This was closed at the level of fascia using  figure-of-eight PDS x3, and level of Scarpa's using running Vicryl.  All incision sites were infiltrated with dilute lipolyzed Marcaine, 40 mL in total and closed at the level of the skin using subcuticular Monocryl followed by Dermabond.  Procedure was then terminated.  The patient tolerated the procedure well.  There were no immediate periprocedural complications.  The patient was taken to the postanesthesia care unit in stable  condition.          ______________________________ Alexis Frock, MD     TM/MEDQ  D:  01/27/2014  T:  01/28/2014  Job:  [id number]

## 2014-01-28 NOTE — Discharge Summary (Signed)
Physician Discharge Summary  Patient ID: Scott Hayden MRN: [id number] DOB/AGE: [birth year] 63 y.o.  Admit date: 01/27/2014 Discharge date: 01/28/2014  Admission Diagnoses: Adenocarcinoma of the prostate  Discharge Diagnoses:  Active Problems:   Malignant neoplasm of prostate   Prostate cancer   Discharged Condition: good  Hospital Course: He underwent a robotic prostatectomy via a preperitoneal approach. He has done well postoperatively. He denies any significant abdominal discomfort but is having some mild port site discomfort as expected. He is tolerating a regular diet with no nausea or vomiting. His JP drain has had diminishing output and is felt ready were removal today. His Foley catheter will be left indwelling and he will be discharged home.  Discharge Exam: Blood pressure 111/66, pulse 65, temperature 97.5 F (36.4 C), temperature source Oral, resp. rate 18, height 6' (1.829 m), weight 122.4 kg (269 lb 13.5 oz), SpO2 98.00%. He is alert, oriented and in no distress. Abdomen is flat, soft, nontender other than at the port sites and he has normal bowel sounds. Genitalia reveals no edema or ecchymoses. His Foley catheter is in place and he is uncircumcised with his foreskin in the normal anatomic position. His urine is completely clear.   Disposition: 01-Home or Self Care     Medication List    STOP taking these medications       aspirin 81 MG tablet     finasteride 5 MG tablet  Commonly known as:  PROSCAR     multivitamin with minerals tablet      TAKE these medications       cetirizine 10 MG tablet  Commonly known as:  ZYRTEC  Take 10 mg by mouth daily as needed for allergies.     ciprofloxacin 500 MG tablet  Commonly known as:  CIPRO  Take 1 tablet (500 mg total) by mouth 2 (two) times daily. Start day prior to office visit for foley removal     esomeprazole 40 MG capsule  Commonly known as:  NEXIUM  Take 40 mg by mouth every evening.     fenofibrate 145  MG tablet  Commonly known as:  TRICOR  Take 145 mg by mouth every evening.     hydrochlorothiazide 12.5 MG capsule  Commonly known as:  MICROZIDE  Take 12.5 mg by mouth every morning.     HYDROcodone-acetaminophen 5-325 MG per tablet  Commonly known as:  NORCO  Take 1-2 tablets by mouth every 6 (six) hours as needed.     latanoprost 0.005 % ophthalmic solution  Commonly known as:  XALATAN  Place 1 drop into both eyes at bedtime.     metoprolol succinate 100 MG 24 hr tablet  Commonly known as:  TOPROL-XL  Take 100 mg by mouth every morning. Take with or immediately following a meal.           Follow-up Information   Follow up with Alexis Frock, MD On 02/06/2014. (9:00)    Specialty:  Urology   Contact information:   [place] [address] [phone]       Signed: Claybon Jabs 01/28/2014, 7:37 AM

## 2014-01-31 ENCOUNTER — Encounter (HOSPITAL_COMMUNITY): Payer: Self-pay | Admitting: Urology

## 2014-02-15 DIAGNOSIS — N3946 Mixed incontinence: Secondary | ICD-10-CM | POA: Diagnosis not present

## 2014-02-15 DIAGNOSIS — R279 Unspecified lack of coordination: Secondary | ICD-10-CM | POA: Diagnosis not present

## 2014-02-15 DIAGNOSIS — M62838 Other muscle spasm: Secondary | ICD-10-CM | POA: Diagnosis not present

## 2014-02-15 DIAGNOSIS — M6281 Muscle weakness (generalized): Secondary | ICD-10-CM | POA: Diagnosis not present

## 2014-02-22 ENCOUNTER — Encounter (HOSPITAL_COMMUNITY): Payer: Self-pay | Admitting: Urology

## 2014-02-27 DIAGNOSIS — N3946 Mixed incontinence: Secondary | ICD-10-CM | POA: Diagnosis not present

## 2014-02-27 DIAGNOSIS — R279 Unspecified lack of coordination: Secondary | ICD-10-CM | POA: Diagnosis not present

## 2014-02-27 DIAGNOSIS — M6281 Muscle weakness (generalized): Secondary | ICD-10-CM | POA: Diagnosis not present

## 2014-02-27 DIAGNOSIS — M62838 Other muscle spasm: Secondary | ICD-10-CM | POA: Diagnosis not present

## 2014-03-08 DIAGNOSIS — M6281 Muscle weakness (generalized): Secondary | ICD-10-CM | POA: Diagnosis not present

## 2014-03-08 DIAGNOSIS — R279 Unspecified lack of coordination: Secondary | ICD-10-CM | POA: Diagnosis not present

## 2014-03-08 DIAGNOSIS — N3946 Mixed incontinence: Secondary | ICD-10-CM | POA: Diagnosis not present

## 2014-03-08 DIAGNOSIS — M62838 Other muscle spasm: Secondary | ICD-10-CM | POA: Diagnosis not present

## 2014-03-16 DIAGNOSIS — C61 Malignant neoplasm of prostate: Secondary | ICD-10-CM | POA: Diagnosis not present

## 2014-03-21 DIAGNOSIS — R279 Unspecified lack of coordination: Secondary | ICD-10-CM | POA: Diagnosis not present

## 2014-03-21 DIAGNOSIS — M6281 Muscle weakness (generalized): Secondary | ICD-10-CM | POA: Diagnosis not present

## 2014-03-21 DIAGNOSIS — N3946 Mixed incontinence: Secondary | ICD-10-CM | POA: Diagnosis not present

## 2014-03-21 DIAGNOSIS — M62838 Other muscle spasm: Secondary | ICD-10-CM | POA: Diagnosis not present

## 2014-03-23 DIAGNOSIS — N393 Stress incontinence (female) (male): Secondary | ICD-10-CM | POA: Diagnosis not present

## 2014-04-03 DIAGNOSIS — R279 Unspecified lack of coordination: Secondary | ICD-10-CM | POA: Diagnosis not present

## 2014-04-03 DIAGNOSIS — N393 Stress incontinence (female) (male): Secondary | ICD-10-CM | POA: Diagnosis not present

## 2014-04-03 DIAGNOSIS — M6281 Muscle weakness (generalized): Secondary | ICD-10-CM | POA: Diagnosis not present

## 2014-04-03 DIAGNOSIS — M62838 Other muscle spasm: Secondary | ICD-10-CM | POA: Diagnosis not present

## 2014-04-25 ENCOUNTER — Encounter: Payer: Self-pay | Admitting: Gastroenterology

## 2014-05-08 ENCOUNTER — Other Ambulatory Visit (HOSPITAL_BASED_OUTPATIENT_CLINIC_OR_DEPARTMENT_OTHER): Payer: Medicare Other

## 2014-05-08 ENCOUNTER — Ambulatory Visit (HOSPITAL_COMMUNITY)
Admission: RE | Admit: 2014-05-08 | Discharge: 2014-05-08 | Disposition: A | Discharge status: Home / Self Care | Payer: Medicare Other | Source: Ambulatory Visit | Attending: Oncology | Admitting: Oncology

## 2014-05-08 DIAGNOSIS — C649 Malignant neoplasm of unspecified kidney, except renal pelvis: Secondary | ICD-10-CM | POA: Insufficient documentation

## 2014-05-08 DIAGNOSIS — K7689 Other specified diseases of liver: Secondary | ICD-10-CM | POA: Insufficient documentation

## 2014-05-08 DIAGNOSIS — C189 Malignant neoplasm of colon, unspecified: Secondary | ICD-10-CM

## 2014-05-08 DIAGNOSIS — C61 Malignant neoplasm of prostate: Secondary | ICD-10-CM | POA: Diagnosis not present

## 2014-05-08 DIAGNOSIS — H409 Unspecified glaucoma: Secondary | ICD-10-CM | POA: Diagnosis not present

## 2014-05-08 DIAGNOSIS — H2640 Unspecified secondary cataract: Secondary | ICD-10-CM | POA: Diagnosis not present

## 2014-05-08 DIAGNOSIS — C187 Malignant neoplasm of sigmoid colon: Secondary | ICD-10-CM | POA: Diagnosis not present

## 2014-05-08 DIAGNOSIS — Z9889 Other specified postprocedural states: Secondary | ICD-10-CM | POA: Diagnosis not present

## 2014-05-08 DIAGNOSIS — Z9079 Acquired absence of other genital organ(s): Secondary | ICD-10-CM | POA: Diagnosis not present

## 2014-05-08 DIAGNOSIS — H4011X0 Primary open-angle glaucoma, stage unspecified: Secondary | ICD-10-CM | POA: Diagnosis not present

## 2014-05-08 LAB — CBC WITH DIFFERENTIAL/PLATELET
BASO%: 1 % (ref 0.0–2.0)
Basophils Absolute: 0.1 10*3/uL (ref 0.0–0.1)
EOS%: 5.6 % (ref 0.0–7.0)
Eosinophils Absolute: 0.4 10*3/uL (ref 0.0–0.5)
HCT: 42 % (ref 38.4–49.9)
HEMOGLOBIN: 14.1 g/dL (ref 13.0–17.1)
LYMPH%: 26.9 % (ref 14.0–49.0)
MCH: 29.7 pg (ref 27.2–33.4)
MCHC: 33.6 g/dL (ref 32.0–36.0)
MCV: 88.6 fL (ref 79.3–98.0)
MONO#: 0.7 10*3/uL (ref 0.1–0.9)
MONO%: 10.3 % (ref 0.0–14.0)
NEUT#: 3.9 10*3/uL (ref 1.5–6.5)
NEUT%: 56.2 % (ref 39.0–75.0)
Platelets: 237 10*3/uL (ref 140–400)
RBC: 4.75 10*6/uL (ref 4.20–5.82)
RDW: 14.4 % (ref 11.0–14.6)
WBC: 6.9 10*3/uL (ref 4.0–10.3)
lymph#: 1.9 10*3/uL (ref 0.9–3.3)

## 2014-05-08 LAB — COMPREHENSIVE METABOLIC PANEL (CC13)
ALT: 29 U/L (ref 0–55)
ANION GAP: 13 meq/L — AB (ref 3–11)
AST: 31 U/L (ref 5–34)
Albumin: 4 g/dL (ref 3.5–5.0)
Alkaline Phosphatase: 35 U/L — ABNORMAL LOW (ref 40–150)
BILIRUBIN TOTAL: 0.61 mg/dL (ref 0.20–1.20)
BUN: 14.8 mg/dL (ref 7.0–26.0)
CO2: 27 mEq/L (ref 22–29)
CREATININE: 1.1 mg/dL (ref 0.7–1.3)
Calcium: 9.5 mg/dL (ref 8.4–10.4)
Chloride: 102 mEq/L (ref 98–109)
Glucose: 107 mg/dl (ref 70–140)
Potassium: 3.4 mEq/L — ABNORMAL LOW (ref 3.5–5.1)
Sodium: 142 mEq/L (ref 136–145)
Total Protein: 7.2 g/dL (ref 6.4–8.3)

## 2014-05-08 LAB — CEA: CEA: 1.3 ng/mL (ref 0.0–5.0)

## 2014-05-08 LAB — LACTATE DEHYDROGENASE (CC13): LDH: 176 U/L (ref 125–245)

## 2014-05-15 ENCOUNTER — Ambulatory Visit (HOSPITAL_BASED_OUTPATIENT_CLINIC_OR_DEPARTMENT_OTHER): Payer: Medicare Other | Admitting: Hematology and Oncology

## 2014-05-15 ENCOUNTER — Telehealth: Payer: Self-pay | Admitting: Hematology and Oncology

## 2014-05-15 ENCOUNTER — Encounter: Payer: Self-pay | Admitting: Hematology and Oncology

## 2014-05-15 VITALS — BP 148/81 | HR 82 | Temp 97.4°F | Resp 19 | Ht 72.0 in | Wt 275.7 lb

## 2014-05-15 DIAGNOSIS — G620 Drug-induced polyneuropathy: Secondary | ICD-10-CM

## 2014-05-15 DIAGNOSIS — C649 Malignant neoplasm of unspecified kidney, except renal pelvis: Secondary | ICD-10-CM | POA: Diagnosis not present

## 2014-05-15 DIAGNOSIS — C642 Malignant neoplasm of left kidney, except renal pelvis: Secondary | ICD-10-CM

## 2014-05-15 DIAGNOSIS — K7689 Other specified diseases of liver: Secondary | ICD-10-CM | POA: Diagnosis not present

## 2014-05-15 DIAGNOSIS — C61 Malignant neoplasm of prostate: Secondary | ICD-10-CM | POA: Diagnosis not present

## 2014-05-15 DIAGNOSIS — C189 Malignant neoplasm of colon, unspecified: Secondary | ICD-10-CM | POA: Diagnosis not present

## 2014-05-15 DIAGNOSIS — K760 Fatty (change of) liver, not elsewhere classified: Secondary | ICD-10-CM

## 2014-05-15 NOTE — Telephone Encounter (Signed)
gv pt appt schedule for may 2016.  °

## 2014-05-15 NOTE — Assessment & Plan Note (Signed)
Will defer to PSA monitoring to his urologist.

## 2014-05-15 NOTE — Assessment & Plan Note (Signed)
I reviewed the most recent guideline with him and his wife. There is no clinical benefit for repeating imaging study in the future. I recommend history, physical examination and blood work in one year

## 2014-05-15 NOTE — Assessment & Plan Note (Signed)
He has signs of fatty liver disease. I recommend weight loss, dietary modification exercise. The patient will attempt to loose 25 pounds over the next 8 months.

## 2014-05-15 NOTE — Assessment & Plan Note (Signed)
This is stable. Recommend observation only. 

## 2014-05-15 NOTE — Assessment & Plan Note (Signed)
This is noted on his most recent CT scan. His blood work is unremarkable. The patient is recommended dietary modification, weight loss and exercise

## 2014-05-15 NOTE — Progress Notes (Signed)
[place] progress notes  Patient Care Team: Horatio Pel, MD as PCP - General (Internal Medicine) Collene Schlichter, Student-PA as Consulting Physician (Gastroenterology) Alexis Frock, MD as Consulting Physician (Urology)  CHIEF COMPLAINTS/PURPOSE OF VISIT:  History of colon cancer, kidney cancer and prostate cancer  HISTORY OF PRESENTING ILLNESS:  Scott Hayden 63 y.o. male was transferred to my care after his prior physician has left.  I reviewed the patient's records extensive and collaborated the history with the patient. 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 He received 12 cycles of FOLFOX chemotherapy over 6 months. He developed a severe distal neuropathy, upper and lower extremities.  During his initial staging evaluation, CT scan revealed a 2 cm lesion in his left kidney. When he recovered from chemotherapy for the colon cancer he underwent a right robotic partial left nephrectomy 03/21/2010. Pathology showed a 2 cm clear cell grade 2 lesion with no vascular invasion. He has a history of BPH and an elevated PSA. Biopsies in the past were negative for cancer. In June of 2015, he underwent robotic prostatectomy and was noted to have bilateral disease in the prostate.  He has had no interim medical problems. He reports no abdominal pain or cramping, no change in bowel habits, no hematochezia or melena. No hematuria. He still had persistent peripheral neuropathy but it does not bother him too much. MEDICAL HISTORY:  Past Medical History  Diagnosis Date  . Glaucoma 06/09/2012  . Hyperlipidemia   . History of gastric polyp   . History of renal cell  carcinoma 05-31-2012    STAGE I  GRADE 2  FUHRMAN--  S/P PARTIAL LEFT NEPHRECOTMY WITH NEGATIVE MARGINS  . GERD (gastroesophageal reflux disease)   . Chemotherapy-induced neuropathy     OXALIPLATINUM CHEMO DRUG--  BILATERAL DISTAL UPPER AND LOWER EXTREMITIES  . History of colon polyps   . History of colon cancer, stage III ONCOLOGIST--  DR Allayne Gitelman--  NO RECURRENCE    SIGMOID COLON CA  STAGE IIIA ,  T2, N1--  S/P COLONECTOMY AND CHEMOTHERAPY  . Hypertension   . BPH (benign prostatic hypertrophy)   . Elevated PSA   . Pulmonary nodule     RIGHT MIDDLE LOBE PER CT 10-26-2013  STABLE  . Wears hearing aid     BILATERAL  . Wears glasses   . Heart murmur     as a child   . OSA on CPAP     cpap settings- 10   . Pneumonia     hx of   . 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 Cancer     colon cancer, kidney cancer   . Prostate cancer     s/p prostatectomy  . Colon cancer     SURGICAL HISTORY: Past Surgical History  Procedure Laterality Date  . Cataract extraction w/ intraocular lens  implant, bilateral  04/2011 & 12/2011  . Knee arthroscopy Left 09/2007  . Inguinal hernia repair Left 01/1977  . Robotic assited partial nephrectomy Left 03-21-2012  . Laparoscopic sigmoid colectomy  03/09/2009  ([place])  . Prostate biopsy N/A 11/30/2013    Procedure: SATURATION  BIOPSY TRANSRECTAL ULTRASONIC PROSTATE (TUBP);  Surgeon: Alexis Frock, MD;  Location: [facility];  Service: Urology;  Laterality: N/A;  . Robot assisted laparoscopic radical prostatectomy N/A 01/27/2014    Procedure: ROBOTIC ASSISTED LAPAROSCOPIC RADICAL PROSTATECTOMY WITH INDOCYANINE GREEN DYE,EXTENSIVE ADHESIOLYSIS " PRE -PERITONEAL";  Surgeon: Alexis Frock, MD;  Location: [facility];  Service: Urology;  Laterality: N/A;  . Lymphadenectomy Bilateral 01/27/2014    Procedure: BILATERAL PELVIC LYMPH NODE DISSECTION;  Surgeon: Alexis Frock, MD;  Location: [facility];  Service: Urology;   Laterality: Bilateral;    SOCIAL HISTORY: History   Social History  . Marital Status: Married    Spouse Name: Thayer Headings    Number of Children: 2  . Years of Education: MS+   Occupational History  . Retired    Social History Main Topics  . Smoking status: Never Smoker   . Smokeless tobacco: Never Used  . Alcohol Use: 0.0 oz/week     Comment: RARE  . Drug Use: No  . Sexual Activity: Not on file   Other Topics Concern  . Not on file   Social History Narrative   Pt lives at home with spouse.    Caffeine Use: 2-3 cups daily.     FAMILY HISTORY: Family History  Problem Relation Age of Onset  . Stomach cancer Mother 27  . Hodgkin's lymphoma Father   . Prostate cancer Father     ALLERGIES:  is allergic to shellfish allergy; contrast media; and ultravist.  MEDICATIONS:  Current Outpatient Prescriptions  Medication Sig Dispense Refill  . cetirizine (ZYRTEC) 10 MG tablet Take 10 mg by mouth daily as needed for allergies.      Marland Kitchen esomeprazole (NEXIUM) 40 MG capsule Take 40 mg by mouth every evening.       . fenofibrate (TRICOR) 145 MG tablet Take 145 mg by mouth every evening.       . hydrochlorothiazide (MICROZIDE) 12.5 MG capsule Take 12.5 mg by mouth every morning.       . latanoprost (XALATAN) 0.005 % ophthalmic solution Place 1 drop into both eyes at bedtime.       . metoprolol succinate (TOPROL-XL) 100 MG 24 hr tablet Take 100 mg by mouth every morning. Take with or immediately following a meal.       No current facility-administered medications for this visit.    REVIEW OF SYSTEMS:   Constitutional: Denies fevers, chills or abnormal night sweats Eyes: Denies blurriness of vision, double vision or watery eyes Ears, nose, mouth, throat, and face: Denies mucositis or sore throat Respiratory: Denies cough, dyspnea or wheezes Cardiovascular: Denies palpitation, chest discomfort or lower extremity swelling Gastrointestinal:  Denies nausea, heartburn or change in bowel  habits Skin: Denies abnormal skin rashes Lymphatics: Denies new lymphadenopathy or easy bruising Neurological:Denies numbness, tingling or new weaknesses Behavioral/Psych: Mood is stable, no new changes  All other systems were reviewed with the patient and are negative.  PHYSICAL EXAMINATION: ECOG PERFORMANCE STATUS: 0 - Asymptomatic  Filed Vitals:   05/15/14 1336  BP: 148/81  Pulse: 82  Temp: 97.4 F (36.3 C)  Resp: 19   Filed Weights   05/15/14 1336  Weight: 275 lb 11.2 oz (125.057 kg)    GENERAL:alert, no distress and comfortable. He is morbidly obese SKIN: skin color, texture, turgor are normal, no rashes or significant lesions EYES: normal, conjunctiva are pink and non-injected, sclera clear OROPHARYNX:no exudate, normal lips, buccal mucosa, and tongue  NECK: supple, thyroid normal size, non-tender, without nodularity LYMPH:  no palpable lymphadenopathy in the cervical, axillary or inguinal LUNGS: clear to auscultation and percussion with normal breathing effort HEART: regular rate & rhythm and no murmurs without lower extremity edema ABDOMEN:abdomen soft, non-tender and normal bowel sounds Musculoskeletal:no cyanosis of digits and no clubbing  PSYCH: alert & oriented x 3 with fluent speech NEURO: no focal motor/sensory deficits  LABORATORY DATA:  I have reviewed the data as listed Lab Results  Component Value Date   WBC 6.9 05/08/2014   HGB 14.1 05/08/2014   HCT 42.0 05/08/2014   MCV 88.6 05/08/2014   PLT 237 05/08/2014    Recent Labs  06/21/13 0928 10/26/13 0755  11/30/13 0730 01/13/14 1325 01/28/14 0448 05/08/14 0807  NA 140 139  < > 143 140 137 142  K 3.9 4.0  < > 3.6* 3.5* 3.3* 3.4*  CL  --   --   --  100 98 98  --   CO2 27 24  --   --  29 28 27   GLUCOSE 95 162*  < > 97 79 114* 107  BUN 11.8 15.6  < > 17 15 15  14.8  CREATININE 1.1 1.1  < > 1.30 1.06 1.25 1.1  CALCIUM 10.4 10.3  --   --  10.1 8.7 9.5  GFRNONAA  --   --   --   --  73* 60*  --   GFRAA   --   --   --   --  85* 70*  --   PROT 8.1 8.0  --   --   --   --  7.2  ALBUMIN 4.1 4.5  --   --   --   --  4.0  AST 25 28  --   --   --   --  31  ALT 26 32  --   --   --   --  29  ALKPHOS 43 41  --   --   --   --  35*  BILITOT 0.67 0.55  --   --   --   --  0.61  < > = values in this interval not displayed.  RADIOGRAPHIC STUDIES: I have personally reviewed the radiological images as listed and agreed with the findings in the report. Ct Abdomen Pelvis Wo Contrast  05/08/2014   CLINICAL DATA:  Colon cancer diagnosed in 2010. Renal cell carcinoma diagnosed in 2010. Partial left nephrectomy and chemotherapy. Colon resection. Recent prostatectomy.  EXAM: CT ABDOMEN AND PELVIS WITHOUT CONTRAST  TECHNIQUE: Multidetector CT imaging of the abdomen and pelvis was performed following the standard protocol without IV contrast.  COMPARISON:  10/26/2012  FINDINGS: Lower chest: Clear lung bases. Normal heart size without pericardial or pleural effusion.  Hepatobiliary: Mild hepatic steatosis. Normal gallbladder, without biliary ductal dilatation.  Spleen: Normal  Pancreas: Normal, without mass or pancreatic ductal dilatation.  Stomach/Bowel: Surgical sutures in the sigmoid. Colonic stool burden suggests constipation. Normal terminal ileum and appendix. Normal stomach, without wall thickening. Normal small bowel without abdominal ascites. No evidence of omental or peritoneal disease.  Adrendals/Urinary tract: Normal adrenal glands. Surgical changes about the upper pole left kidney. No hydroureter or ureteric calculi. Normal urinary bladder.  Vascular/Lymphatic: Aortic atherosclerosis. No retroperitoneal or retrocrural adenopathy. No pelvic adenopathy.  Reproductive: Prostatectomy. No evidence of locally recurrent disease.  Musculoskeletal: No focal osseous abnormality.  Other: No significant free fluid. Fat containing right inguinal hernia.  IMPRESSION: 1. No acute process or evidence of metastatic disease in the  abdomen or pelvis. 2. Hepatic steatosis. 3.  Possible constipation. 4. Interval prostatectomy, without recurrent or metastatic disease.   Electronically Signed   By: Abigail Miyamoto M.D.   On: 05/08/2014 10:08    ASSESSMENT & PLAN:  Colon cancer I reviewed the most recent guideline with him and his wife. There is no clinical benefit for repeating imaging study in the future. I recommend history, physical examination and blood work in one year  Cancer of kidney I reviewed the most recent guideline with him and his wife. There is no clinical benefit for repeating imaging study in the future. I recommend history, physical examination and blood work in one year  Malignant neoplasm of prostate Will defer to PSA monitoring to his urologist.  Drug-induced peripheral neuropathy This is stable. Recommend observation only.  Morbid obesity He has signs of fatty liver disease. I recommend weight loss, dietary modification exercise. The patient will attempt to loose 25 pounds over the next 8 months.  Nonalcoholic fatty liver disease This is noted on his most recent CT scan. His blood work is unremarkable. The patient is recommended dietary modification, weight loss and exercise  With diagnosis of multiple malignancies and strong family history of cancer, the patient had genetic screening in the past. I recommend his children to undergo screening colonoscopy at the age of 87. Orders Placed This Encounter  Procedures  . Comprehensive metabolic panel    Standing Status: Future     Number of Occurrences:      Standing Expiration Date: 06/19/2015  . CBC with Differential    Standing Status: Future     Number of Occurrences:      Standing Expiration Date: 06/19/2015  . CEA    Standing Status: Future     Number of Occurrences:      Standing Expiration Date: 06/19/2015  . Lactate dehydrogenase    Standing Status: Future     Number of Occurrences:      Standing Expiration Date: 06/19/2015    All  questions were answered. The patient knows to call the clinic with any problems, questions or concerns. I spent 30 minutes counseling the patient face to face. The total time spent in the appointment was 40 minutes and more than 50% was on counseling.     [facility], [place], MD 05/15/2014 9:57 PM

## 2014-06-13 DIAGNOSIS — H26491 Other secondary cataract, right eye: Secondary | ICD-10-CM | POA: Diagnosis not present

## 2014-06-13 DIAGNOSIS — H2640 Unspecified secondary cataract: Secondary | ICD-10-CM | POA: Diagnosis not present

## 2014-06-19 DIAGNOSIS — Z125 Encounter for screening for malignant neoplasm of prostate: Secondary | ICD-10-CM | POA: Diagnosis not present

## 2014-06-19 DIAGNOSIS — E780 Pure hypercholesterolemia: Secondary | ICD-10-CM | POA: Diagnosis not present

## 2014-06-19 DIAGNOSIS — Z0000 Encounter for general adult medical examination without abnormal findings: Secondary | ICD-10-CM | POA: Diagnosis not present

## 2014-06-19 DIAGNOSIS — I10 Essential (primary) hypertension: Secondary | ICD-10-CM | POA: Diagnosis not present

## 2014-06-20 DIAGNOSIS — C61 Malignant neoplasm of prostate: Secondary | ICD-10-CM | POA: Diagnosis not present

## 2014-06-21 ENCOUNTER — Ambulatory Visit (AMBULATORY_SURGERY_CENTER): Payer: Self-pay | Admitting: *Deleted

## 2014-06-21 VITALS — Ht 72.0 in | Wt 270.0 lb

## 2014-06-21 DIAGNOSIS — Z85038 Personal history of other malignant neoplasm of large intestine: Secondary | ICD-10-CM

## 2014-06-21 MED ORDER — MOVIPREP 100 G PO SOLR: ORAL | Status: DC

## 2014-06-21 NOTE — Progress Notes (Signed)
No allergies to eggs or soy. No problems with anesthesia.  Pt given Emmi instructions for colonoscopy  No oxygen use  No diet drug use  

## 2014-06-22 DIAGNOSIS — G629 Polyneuropathy, unspecified: Secondary | ICD-10-CM | POA: Diagnosis not present

## 2014-06-22 DIAGNOSIS — Z8546 Personal history of malignant neoplasm of prostate: Secondary | ICD-10-CM | POA: Diagnosis not present

## 2014-06-22 DIAGNOSIS — Z23 Encounter for immunization: Secondary | ICD-10-CM | POA: Diagnosis not present

## 2014-06-22 DIAGNOSIS — T50995A Adverse effect of other drugs, medicaments and biological substances, initial encounter: Secondary | ICD-10-CM | POA: Diagnosis not present

## 2014-06-22 DIAGNOSIS — Z0000 Encounter for general adult medical examination without abnormal findings: Secondary | ICD-10-CM | POA: Diagnosis not present

## 2014-06-27 DIAGNOSIS — C61 Malignant neoplasm of prostate: Secondary | ICD-10-CM | POA: Diagnosis not present

## 2014-06-27 DIAGNOSIS — G4733 Obstructive sleep apnea (adult) (pediatric): Secondary | ICD-10-CM | POA: Diagnosis not present

## 2014-06-27 DIAGNOSIS — C649 Malignant neoplasm of unspecified kidney, except renal pelvis: Secondary | ICD-10-CM | POA: Diagnosis not present

## 2014-06-27 DIAGNOSIS — N393 Stress incontinence (female) (male): Secondary | ICD-10-CM | POA: Diagnosis not present

## 2014-07-05 ENCOUNTER — Encounter: Payer: Self-pay | Admitting: Gastroenterology

## 2014-07-05 ENCOUNTER — Ambulatory Visit (AMBULATORY_SURGERY_CENTER): Payer: Medicare Other | Admitting: Gastroenterology

## 2014-07-05 VITALS — BP 130/86 | HR 68 | Temp 96.4°F | Resp 15 | Ht 72.0 in | Wt 270.0 lb

## 2014-07-05 DIAGNOSIS — G4733 Obstructive sleep apnea (adult) (pediatric): Secondary | ICD-10-CM | POA: Diagnosis not present

## 2014-07-05 DIAGNOSIS — I10 Essential (primary) hypertension: Secondary | ICD-10-CM | POA: Diagnosis not present

## 2014-07-05 DIAGNOSIS — Z85038 Personal history of other malignant neoplasm of large intestine: Secondary | ICD-10-CM | POA: Diagnosis not present

## 2014-07-05 DIAGNOSIS — D124 Benign neoplasm of descending colon: Secondary | ICD-10-CM | POA: Diagnosis not present

## 2014-07-05 HISTORY — PX: COLONOSCOPY: SHX5424

## 2014-07-05 MED ORDER — SODIUM CHLORIDE 0.9 % IV SOLN: 500.0000 mL | INTRAVENOUS | Status: DC

## 2014-07-05 NOTE — Progress Notes (Signed)
Report to PACU, RN, vss, BBS= Clear.  

## 2014-07-05 NOTE — Patient Instructions (Signed)
Discharge instructions given. Handouts on polyps and diverticulosis. Resume previous medications. YOU HAD AN ENDOSCOPIC PROCEDURE TODAY AT THE [facility]: Refer to the procedure report that was given to you for any specific questions about what was found during the examination.  If the procedure report does not answer your questions, please call your gastroenterologist to clarify.  If you requested that your care partner not be given the details of your procedure findings, then the procedure report has been included in a sealed envelope for you to review at your convenience later.  YOU SHOULD EXPECT: Some feelings of bloating in the abdomen. Passage of more gas than usual.  Walking can help get rid of the air that was put into your GI tract during the procedure and reduce the bloating. If you had a lower endoscopy (such as a colonoscopy or flexible sigmoidoscopy) you may notice spotting of blood in your stool or on the toilet paper. If you underwent a bowel prep for your procedure, then you may not have a normal bowel movement for a few days.  DIET: Your first meal following the procedure should be a light meal and then it is ok to progress to your normal diet.  A half-sandwich or bowl of soup is an example of a good first meal.  Heavy or fried foods are harder to digest and may make you feel nauseous or bloated.  Likewise meals heavy in dairy and vegetables can cause extra gas to form and this can also increase the bloating.  Drink plenty of fluids but you should avoid alcoholic beverages for 24 hours.  ACTIVITY: Your care partner should take you home directly after the procedure.  You should plan to take it easy, moving slowly for the rest of the day.  You can resume normal activity the day after the procedure however you should NOT DRIVE or use heavy machinery for 24 hours (because of the sedation medicines used during the test).    SYMPTOMS TO REPORT IMMEDIATELY: A gastroenterologist  can be reached at any hour.  During normal business hours, 8:30 AM to 5:00 PM Monday through Friday, call [phone].  After hours and on weekends, please call the GI answering service at [phone] who will take a message and have the physician on call contact you.   Following lower endoscopy (colonoscopy or flexible sigmoidoscopy):  Excessive amounts of blood in the stool  Significant tenderness or worsening of abdominal pains  Swelling of the abdomen that is new, acute  Fever of 100F or higher  FOLLOW UP: If any biopsies were taken you will be contacted by phone or by letter within the next 1-3 weeks.  Call your gastroenterologist if you have not heard about the biopsies in 3 weeks.  Our staff will call the home number listed on your records the next business day following your procedure to check on you and address any questions or concerns that you may have at that time regarding the information given to you following your procedure. This is a courtesy call and so if there is no answer at the home number and we have not heard from you through the emergency physician on call, we will assume that you have returned to your regular daily activities without incident.  SIGNATURES/CONFIDENTIALITY: You and/or your care partner have signed paperwork which will be entered into your electronic medical record.  These signatures attest to the fact that that the information above on your After Visit Summary has been reviewed   and is understood.  Full responsibility of the confidentiality of this discharge information lies with you and/or your care-partner. 

## 2014-07-05 NOTE — Op Note (Signed)
[facility]  [address] [place], [address]   COLONOSCOPY PROCEDURE REPORT  PATIENT: Scott Hayden, Scott Hayden  MR#: [id number] BIRTHDATE: 24-Mar-1951 , 70  yrs. old GENDER: male ENDOSCOPIST: Milus Banister, MD PROCEDURE DATE:  07/05/2014 PROCEDURE:   Colonoscopy with snare polypectomy First Screening Colonoscopy - Avg.  risk and is 50 yrs.  old or older - No.  Prior Negative Screening - Now for repeat screening. N/A  History of Adenoma - Now for follow-up colonoscopy & has been > or = to 3 yrs.  N/A  Polyps Removed Today? Yes. ASA CLASS:   Class II INDICATIONS:T2N1 sigmoid colon cancer resected [place] 2010; colonoscopies in [place] 2011, 2012 showed no polyps, no recurrent cancer. MEDICATIONS: Monitored anesthesia care and Propofol 300 mg IV  DESCRIPTION OF PROCEDURE:   After the risks benefits and alternatives of the procedure were thoroughly explained, informed consent was obtained.  The digital rectal exam revealed no abnormalities of the rectum.   The LB [id number]  endoscope was introduced through the anus and advanced to the cecum, which was identified by both the appendix and ileocecal valve. No adverse events experienced.   The quality of the prep was excellent.  The instrument was then slowly withdrawn as the colon was fully examined.   COLON FINDINGS: A normal appearing anastomosis was noted at 20cm from the anus. There were multiple diverticulum in the left colon. A sessile polyp measuring 4 mm in size was found in the descending colon.  A polypectomy was performed with a cold snare.  The resection was complete, the polyp tissue was completely retrieved and sent to histology.  Retroflexed views revealed no abnormalities. The time to cecum=4 minutes 12 seconds.  Withdrawal time=12 minutes 38 seconds.  The scope was withdrawn and the procedure completed. COMPLICATIONS: There were no immediate complications.  ENDOSCOPIC IMPRESSION: Sessile polyp was found in  the descending colon; polypectomy was performed with a cold snare Normal appearing anastomosis at 20cm Left sided diverticulosis.  RECOMMENDATIONS: If the polyp(s) removed today are proven to be adenomatous (pre-cancerous) polyps, you will need a repeat colonoscopy in 5 years.  You will receive a letter within 1-2 weeks with the results of your biopsy as well as final recommendations.  Please call my office if you have not received a letter after 3 weeks.  eSigned:  Milus Banister, MD 07/05/2014 9:36 AM

## 2014-07-05 NOTE — Progress Notes (Signed)
Called to room to assist during endoscopic procedure.  Patient ID and intended procedure confirmed with present staff. Received instructions for my participation in the procedure from the performing physician.  

## 2014-07-06 ENCOUNTER — Telehealth: Payer: Self-pay | Admitting: *Deleted

## 2014-07-06 NOTE — Telephone Encounter (Signed)
No answer, message left for the patient. 

## 2014-07-11 ENCOUNTER — Encounter: Payer: Self-pay | Admitting: Gastroenterology

## 2014-07-12 ENCOUNTER — Encounter: Payer: Self-pay | Admitting: Hematology and Oncology

## 2014-07-14 ENCOUNTER — Encounter: Payer: Self-pay | Admitting: *Deleted

## 2014-07-14 NOTE — Telephone Encounter (Signed)
Message sent requesting date vaccine was received.  Awaiting patient response.

## 2014-07-28 ENCOUNTER — Encounter: Payer: Self-pay | Admitting: Medical Oncology

## 2014-09-20 ENCOUNTER — Telehealth: Payer: Self-pay | Admitting: Hematology and Oncology

## 2014-09-20 NOTE — Telephone Encounter (Signed)
lvm for pt regarding to 5.9 appt moved to 5.10 due to md on pal....mailed pt appt sched/avs and letter

## 2014-10-26 DIAGNOSIS — H4011X1 Primary open-angle glaucoma, mild stage: Secondary | ICD-10-CM | POA: Diagnosis not present

## 2014-10-26 DIAGNOSIS — H43811 Vitreous degeneration, right eye: Secondary | ICD-10-CM | POA: Diagnosis not present

## 2014-11-24 ENCOUNTER — Telehealth: Payer: Self-pay | Admitting: Hematology and Oncology

## 2014-11-24 NOTE — Telephone Encounter (Signed)
lvm for pt regarding to 5.10 appt moved to 5.17 due to MD out of the office.....mailed pt appt sched/letter °

## 2014-12-18 DIAGNOSIS — E780 Pure hypercholesterolemia: Secondary | ICD-10-CM | POA: Diagnosis not present

## 2014-12-18 DIAGNOSIS — I10 Essential (primary) hypertension: Secondary | ICD-10-CM | POA: Diagnosis not present

## 2014-12-25 ENCOUNTER — Ambulatory Visit: Payer: Medicare Other | Admitting: Hematology and Oncology

## 2014-12-25 ENCOUNTER — Other Ambulatory Visit: Payer: Medicare Other

## 2014-12-25 DIAGNOSIS — C61 Malignant neoplasm of prostate: Secondary | ICD-10-CM | POA: Diagnosis not present

## 2014-12-26 ENCOUNTER — Other Ambulatory Visit: Payer: Medicare Other

## 2014-12-26 ENCOUNTER — Ambulatory Visit: Payer: Medicare Other | Admitting: Hematology and Oncology

## 2014-12-26 DIAGNOSIS — I10 Essential (primary) hypertension: Secondary | ICD-10-CM | POA: Diagnosis not present

## 2014-12-26 DIAGNOSIS — E6609 Other obesity due to excess calories: Secondary | ICD-10-CM | POA: Diagnosis not present

## 2014-12-26 DIAGNOSIS — E780 Pure hypercholesterolemia: Secondary | ICD-10-CM | POA: Diagnosis not present

## 2015-01-01 DIAGNOSIS — N393 Stress incontinence (female) (male): Secondary | ICD-10-CM | POA: Diagnosis not present

## 2015-01-01 DIAGNOSIS — N5201 Erectile dysfunction due to arterial insufficiency: Secondary | ICD-10-CM | POA: Diagnosis not present

## 2015-01-01 DIAGNOSIS — C649 Malignant neoplasm of unspecified kidney, except renal pelvis: Secondary | ICD-10-CM | POA: Diagnosis not present

## 2015-01-01 DIAGNOSIS — C61 Malignant neoplasm of prostate: Secondary | ICD-10-CM | POA: Diagnosis not present

## 2015-01-02 ENCOUNTER — Ambulatory Visit (HOSPITAL_BASED_OUTPATIENT_CLINIC_OR_DEPARTMENT_OTHER): Payer: Medicare Other | Admitting: Hematology and Oncology

## 2015-01-02 ENCOUNTER — Telehealth: Payer: Self-pay | Admitting: Hematology and Oncology

## 2015-01-02 ENCOUNTER — Other Ambulatory Visit (HOSPITAL_BASED_OUTPATIENT_CLINIC_OR_DEPARTMENT_OTHER): Payer: Medicare Other

## 2015-01-02 ENCOUNTER — Encounter: Payer: Self-pay | Admitting: Hematology and Oncology

## 2015-01-02 VITALS — BP 144/83 | HR 89 | Temp 98.3°F | Resp 18 | Ht 72.0 in | Wt 270.4 lb

## 2015-01-02 DIAGNOSIS — C649 Malignant neoplasm of unspecified kidney, except renal pelvis: Secondary | ICD-10-CM

## 2015-01-02 DIAGNOSIS — Z8546 Personal history of malignant neoplasm of prostate: Secondary | ICD-10-CM

## 2015-01-02 DIAGNOSIS — Z85038 Personal history of other malignant neoplasm of large intestine: Secondary | ICD-10-CM

## 2015-01-02 DIAGNOSIS — C189 Malignant neoplasm of colon, unspecified: Secondary | ICD-10-CM

## 2015-01-02 DIAGNOSIS — Z85528 Personal history of other malignant neoplasm of kidney: Secondary | ICD-10-CM

## 2015-01-02 DIAGNOSIS — C642 Malignant neoplasm of left kidney, except renal pelvis: Secondary | ICD-10-CM

## 2015-01-02 DIAGNOSIS — C61 Malignant neoplasm of prostate: Secondary | ICD-10-CM

## 2015-01-02 LAB — COMPREHENSIVE METABOLIC PANEL (CC13)
ALBUMIN: 4.6 g/dL (ref 3.5–5.0)
ALT: 33 U/L (ref 0–55)
ANION GAP: 15 meq/L — AB (ref 3–11)
AST: 34 U/L (ref 5–34)
Alkaline Phosphatase: 40 U/L (ref 40–150)
BUN: 14.2 mg/dL (ref 7.0–26.0)
CALCIUM: 9.9 mg/dL (ref 8.4–10.4)
CO2: 26 meq/L (ref 22–29)
CREATININE: 1.2 mg/dL (ref 0.7–1.3)
Chloride: 100 mEq/L (ref 98–109)
EGFR: 64 mL/min/{1.73_m2} — AB (ref >90)
Glucose: 126 mg/dl (ref 70–140)
Potassium: 3.3 mEq/L — ABNORMAL LOW (ref 3.5–5.1)
Sodium: 141 mEq/L (ref 136–145)
Total Bilirubin: 0.88 mg/dL (ref 0.20–1.20)
Total Protein: 7.8 g/dL (ref 6.4–8.3)

## 2015-01-02 LAB — CBC WITH DIFFERENTIAL/PLATELET
BASO%: 0.9 % (ref 0.0–2.0)
Basophils Absolute: 0.1 10*3/uL (ref 0.0–0.1)
EOS ABS: 0.2 10*3/uL (ref 0.0–0.5)
EOS%: 2.8 % (ref 0.0–7.0)
HCT: 44.1 % (ref 38.4–49.9)
HGB: 14.9 g/dL (ref 13.0–17.1)
LYMPH%: 28.6 % (ref 14.0–49.0)
MCH: 30.2 pg (ref 27.2–33.4)
MCHC: 33.8 g/dL (ref 32.0–36.0)
MCV: 89.4 fL (ref 79.3–98.0)
MONO#: 0.5 10*3/uL (ref 0.1–0.9)
MONO%: 8.8 % (ref 0.0–14.0)
NEUT#: 3.6 10*3/uL (ref 1.5–6.5)
NEUT%: 58.9 % (ref 39.0–75.0)
Platelets: 262 10*3/uL (ref 140–400)
RBC: 4.94 10*6/uL (ref 4.20–5.82)
RDW: 13.9 % (ref 11.0–14.6)
WBC: 6.1 10*3/uL (ref 4.0–10.3)
lymph#: 1.7 10*3/uL (ref 0.9–3.3)

## 2015-01-02 LAB — LACTATE DEHYDROGENASE (CC13): LDH: 182 U/L (ref 125–245)

## 2015-01-02 NOTE — Assessment & Plan Note (Signed)
He follows with urologist regularly. Reportedly, he had ultrasound done not long ago which is negative for recurrence.

## 2015-01-02 NOTE — Assessment & Plan Note (Signed)
Recent PSA was negative and undetectable. Defer to urology for follow-up.

## 2015-01-02 NOTE — Telephone Encounter (Signed)
per pof to sch pt appt-gave pt copy of sch °

## 2015-01-02 NOTE — Progress Notes (Signed)
[facility] OFFICE PROGRESS NOTE  Patient Care Team: Scott Pretty, MD as PCP - General (Internal Medicine) Collene Schlichter, Student-PA as Consulting Physician (Gastroenterology) Alexis Frock, MD as Consulting Physician (Urology)  SUMMARY OF ONCOLOGIC HISTORY: History of colon cancer, kidney cancer and prostate cancer  HISTORY OF PRESENTING ILLNESS:  Scott Hayden was transferred to my care after his prior physician has left.  I reviewed the patient's records extensive and collaborated the history with the patient. 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 He received 12 cycles of FOLFOX chemotherapy over 6 months. He developed a severe distal neuropathy, upper and lower extremities.  During his initial staging evaluation, CT scan revealed a 2 cm lesion in his left kidney. When he recovered from chemotherapy for the colon cancer he underwent a right robotic partial left nephrectomy 03/21/2010. Pathology showed a 2 cm clear cell grade 2 lesion with no vascular invasion. He has a history of BPH and an elevated PSA. Biopsies in the past were negative for cancer. In June of 2015, he underwent robotic prostatectomy and was noted to have bilateral disease in the prostate. Repeat colonoscopy in November 2015 only show 1 Polyp and biopsy was benign  INTERVAL HISTORY: Please see below for problem oriented charting. He feels well. His struggled with weight loss program. He managed to lose 5 pounds since I saw him. Denies any change in bowel habits. No hematuria. He was seen by urologist recently and PSA was undetectable  REVIEW OF SYSTEMS:   Constitutional: Denies fevers, chills or  abnormal weight loss Eyes: Denies blurriness of vision Ears, nose, mouth, throat, and face: Denies mucositis or sore throat Respiratory: Denies cough, dyspnea or wheezes Cardiovascular: Denies palpitation, chest discomfort or lower extremity swelling Gastrointestinal:  Denies nausea, heartburn or change in bowel habits Skin: Denies abnormal skin rashes Lymphatics: Denies new lymphadenopathy or easy bruising Neurological:Denies numbness, tingling or new weaknesses Behavioral/Psych: Mood is stable, no new changes  All other systems were reviewed with the patient and are negative.  I have reviewed the past medical history, past surgical history, social history and family history with the patient and they are unchanged from previous note.  ALLERGIES:  is allergic to shellfish allergy; contrast media; and ultravist.  MEDICATIONS:  Current Outpatient Prescriptions  Medication Sig Dispense Refill  . aspirin 81 MG tablet Take 81 mg by mouth daily.    . calcium carbonate (OS-CAL) 600 MG TABS tablet Take 600 mg by mouth daily with breakfast.    . cetirizine (ZYRTEC) 10 MG tablet Take 10 mg by mouth daily as needed for allergies.    Marland Kitchen esomeprazole (NEXIUM) 40 MG capsule Take 40 mg by mouth every evening.     . fenofibrate (TRICOR) 145 MG tablet Take 145 mg by mouth every evening.     Marland Kitchen FIBER PO Take by mouth daily.    . hydrochlorothiazide (MICROZIDE) 12.5 MG capsule Take 12.5 mg by mouth every morning.     . latanoprost (XALATAN) 0.005 % ophthalmic solution Place 1 drop into both eyes at bedtime.     . metoprolol succinate (TOPROL-XL) 100 MG 24 hr tablet Take 100 mg by mouth every morning. Take with or  immediately following a meal.    . Multiple Vitamin (MULTIVITAMIN) tablet Take 1 tablet by mouth daily.     No current facility-administered medications for this visit.    PHYSICAL EXAMINATION: ECOG PERFORMANCE STATUS: 0 - Asymptomatic  Filed Vitals:   01/02/15 1327  BP: 144/83  Pulse: 89   Temp: 98.3 F (36.8 C)  Resp: 18   Filed Weights   01/02/15 1327  Weight: 270 lb 6.4 oz (122.653 kg)    GENERAL:alert, no distress and comfortable. He is morbidly obese SKIN: skin color, texture, turgor are normal, no rashes or significant lesions EYES: normal, Conjunctiva are pink and non-injected, sclera clear OROPHARYNX:no exudate, no erythema and lips, buccal mucosa, and tongue normal  NECK: supple, thyroid normal size, non-tender, without nodularity LYMPH:  no palpable lymphadenopathy in the cervical, axillary or inguinal LUNGS: clear to auscultation and percussion with normal breathing effort HEART: regular rate & rhythm and no murmurs and no lower extremity edema ABDOMEN:abdomen soft, non-tender and normal bowel sounds Musculoskeletal:no cyanosis of digits and no clubbing  NEURO: alert & oriented x 3 with fluent speech, no focal motor/sensory deficits  LABORATORY DATA:  I have reviewed the data as listed    Component Value Date/Time   NA 141 01/02/2015 1309   NA 137 01/28/2014 0448   K 3.3* 01/02/2015 1309   K 3.3* 01/28/2014 0448   CL 98 01/28/2014 0448   CL 105 12/24/2012 1414   CO2 26 01/02/2015 1309   CO2 28 01/28/2014 0448   GLUCOSE 126 01/02/2015 1309   GLUCOSE 114* 01/28/2014 0448   GLUCOSE 119* 12/24/2012 1414   BUN 14.2 01/02/2015 1309   BUN 15 01/28/2014 0448   CREATININE 1.2 01/02/2015 1309   CREATININE 1.25 01/28/2014 0448   CALCIUM 9.9 01/02/2015 1309   CALCIUM 8.7 01/28/2014 0448   PROT 7.8 01/02/2015 1309   ALBUMIN 4.6 01/02/2015 1309   AST 34 01/02/2015 1309   ALT 33 01/02/2015 1309   ALKPHOS 40 01/02/2015 1309   BILITOT 0.88 01/02/2015 1309   GFRNONAA 60* 01/28/2014 0448   GFRAA 70* 01/28/2014 0448    No results found for: SPEP, UPEP  Lab Results  Component Value Date   WBC 6.1 01/02/2015   NEUTROABS 3.6 01/02/2015   HGB 14.9 01/02/2015   HCT 44.1 01/02/2015   MCV 89.4 01/02/2015   PLT 262 01/02/2015      Chemistry       Component Value Date/Time   NA 141 01/02/2015 1309   NA 137 01/28/2014 0448   K 3.3* 01/02/2015 1309   K 3.3* 01/28/2014 0448   CL 98 01/28/2014 0448   CL 105 12/24/2012 1414   CO2 26 01/02/2015 1309   CO2 28 01/28/2014 0448   BUN 14.2 01/02/2015 1309   BUN 15 01/28/2014 0448   CREATININE 1.2 01/02/2015 1309   CREATININE 1.25 01/28/2014 0448      Component Value Date/Time   CALCIUM 9.9 01/02/2015 1309   CALCIUM 8.7 01/28/2014 0448   ALKPHOS 40 01/02/2015 1309   AST 34 01/02/2015 1309   ALT 33 01/02/2015 1309   BILITOT 0.88 01/02/2015 1309     ASSESSMENT & PLAN:  Cancer of left kidney He follows with urologist regularly. Reportedly, he had ultrasound done not long ago which is negative for recurrence.   Colon cancer Colonoscopy 2015 was negative for disease recurrence. CEA is pending. Continue surveillance colonoscopy. There is no indication for routine surveillance imaging from now on   Malignant neoplasm  of prostate Recent PSA was negative and undetectable. Defer to urology for follow-up.   Morbid obesity He has lost 5 pounds since I saw him. He made a commitment to lose at least 10 pounds before see him back next year.   I reviewed his urologist progress note  Orders Placed This Encounter  Procedures  . CBC with Differential/Platelet    Standing Status: Future     Number of Occurrences:      Standing Expiration Date: 02/06/2016  . Comprehensive metabolic panel    Standing Status: Future     Number of Occurrences:      Standing Expiration Date: 02/06/2016  . Lactate dehydrogenase    Standing Status: Future     Number of Occurrences:      Standing Expiration Date: 02/06/2016  . CEA    Standing Status: Future     Number of Occurrences:      Standing Expiration Date: 02/06/2016   All questions were answered. The patient knows to call the clinic with any problems, questions or concerns. No barriers to learning was detected. I spent 25 minutes counseling  the patient face to face. The total time spent in the appointment was 30 minutes and more than 50% was on counseling and review of test results     [facility], [place], MD 01/02/2015 2:00 PM

## 2015-01-02 NOTE — Assessment & Plan Note (Signed)
He has lost 5 pounds since I saw him. He made a commitment to lose at least 10 pounds before see him back next year.

## 2015-01-02 NOTE — Assessment & Plan Note (Signed)
Colonoscopy 2015 was negative for disease recurrence. CEA is pending. Continue surveillance colonoscopy. There is no indication for routine surveillance imaging from now on

## 2015-01-03 ENCOUNTER — Telehealth: Payer: Self-pay | Admitting: *Deleted

## 2015-01-03 LAB — CEA: CEA: 1.2 ng/mL (ref 0.0–5.0)

## 2015-01-03 NOTE — Telephone Encounter (Signed)
-----   Message from Heath Lark, MD sent at 01/03/2015  2:12 PM EDT ----- Regarding: CEA Pls let him know CEA normal ----- Message -----    From: [facility]    Sent: 01/02/2015   1:24 PM      To: Heath Lark, MD

## 2015-01-03 NOTE — Telephone Encounter (Signed)
Informed wife of CEA wnl.  She verbalized understanding.

## 2015-01-08 ENCOUNTER — Encounter: Payer: Self-pay | Admitting: Hematology and Oncology

## 2015-01-08 NOTE — Telephone Encounter (Signed)
Pt notified of CEA results

## 2015-01-29 DIAGNOSIS — H04123 Dry eye syndrome of bilateral lacrimal glands: Secondary | ICD-10-CM | POA: Diagnosis not present

## 2015-04-25 DIAGNOSIS — H4011X1 Primary open-angle glaucoma, mild stage: Secondary | ICD-10-CM | POA: Diagnosis not present

## 2015-04-26 IMAGING — CT CT ABD-PEL WO/W CM
3 of 12 series · 13 of 46 positions shown, 19 images · IV contrast (OMNIPAQUE)
Comparison: none

[Series 4: renal arterial · axial · arterial · 0.89mm/px · z∈[-334,-202]mm · 5 of 89 slices shown]
[im 12/89  soft-tissue]
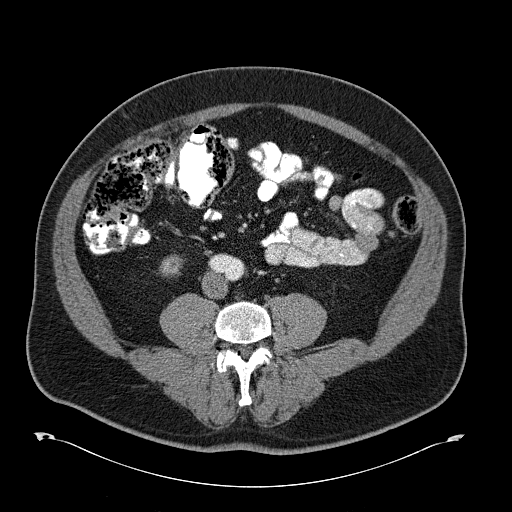
[im 23/89  soft-tissue]
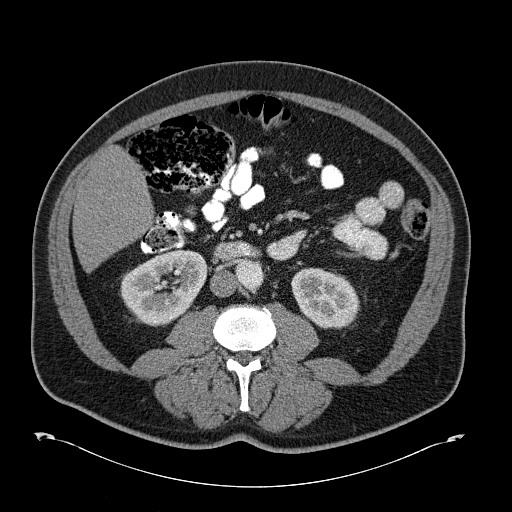
[im 34/89  soft-tissue]
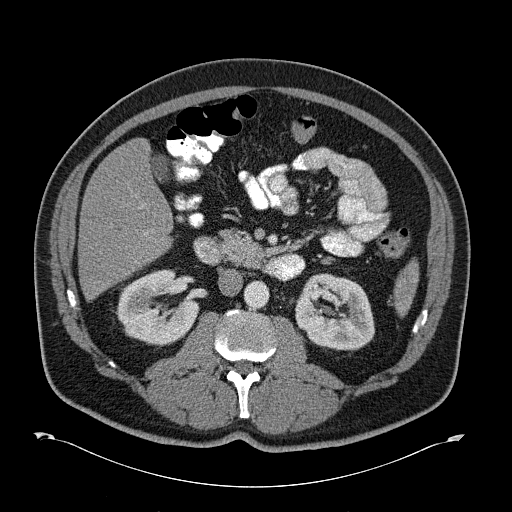
[im 45/89  soft-tissue]
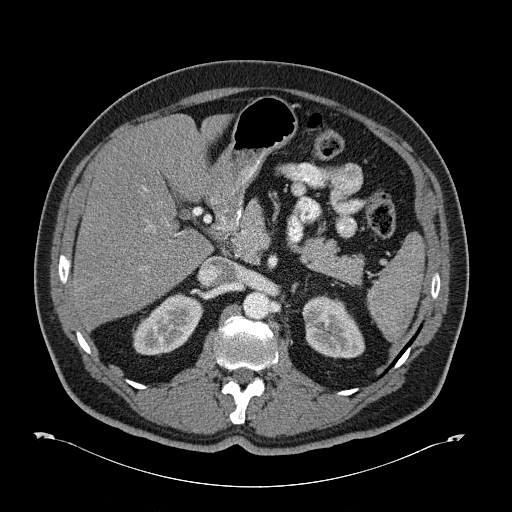
[im 56/89  soft-tissue]
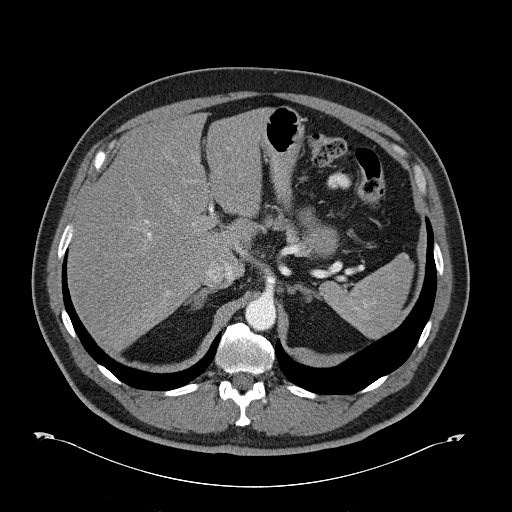

[Series 6: venous · axial · portal-venous · 0.89mm/px · z∈[-504,-164]mm · 7 of 92 slices shown, 12 images]
[im 12/92  soft-tissue]
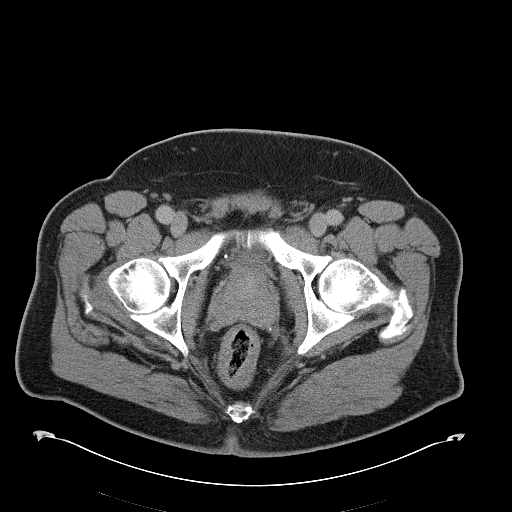
[im 12/92  bone]
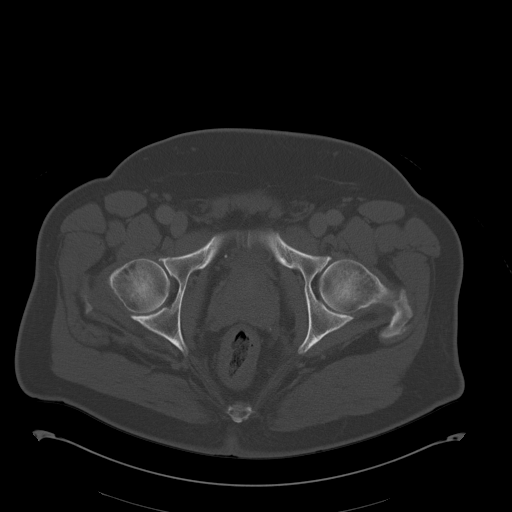
[im 23/92  soft-tissue]
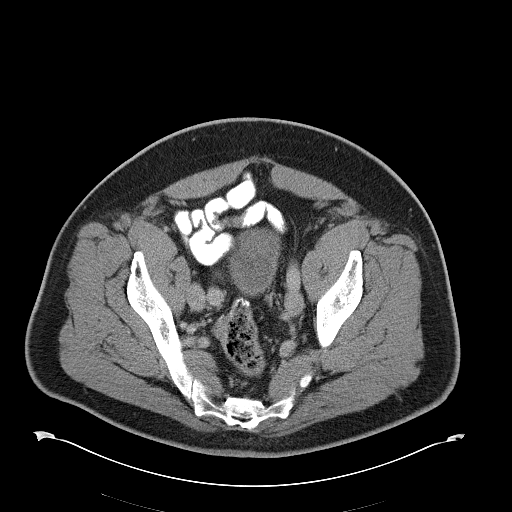
[im 35/92  soft-tissue]
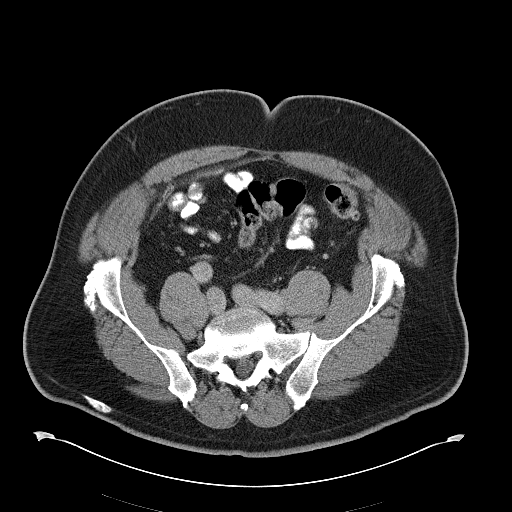
[im 46/92  soft-tissue]
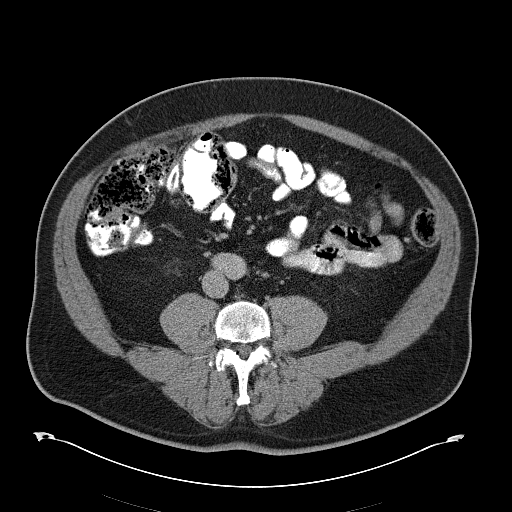
[im 46/92  lung]
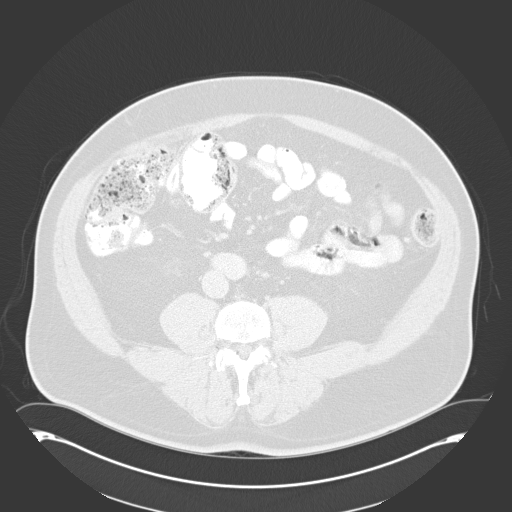
[im 57/92  soft-tissue]
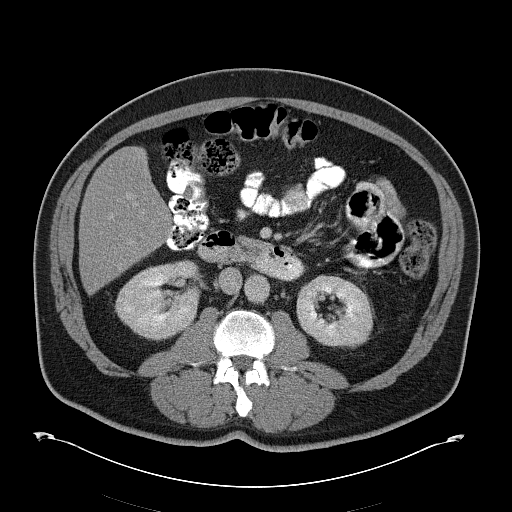
[im 57/92  lung]
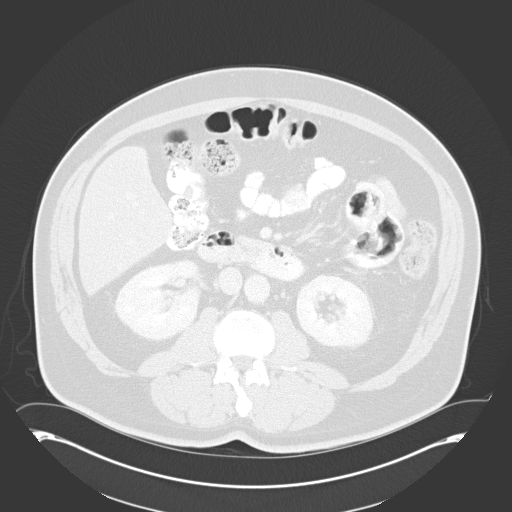
[im 69/92  soft-tissue]
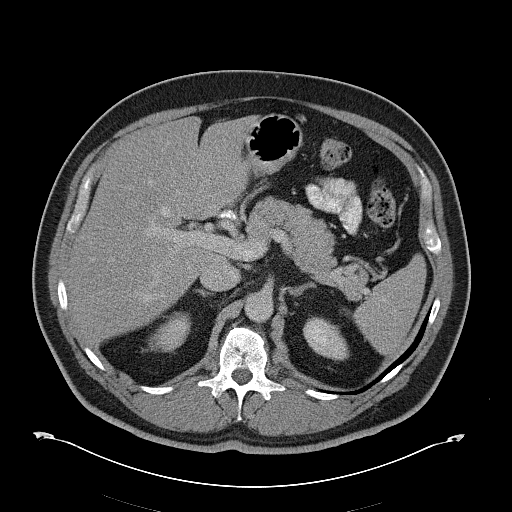
[im 69/92  lung]
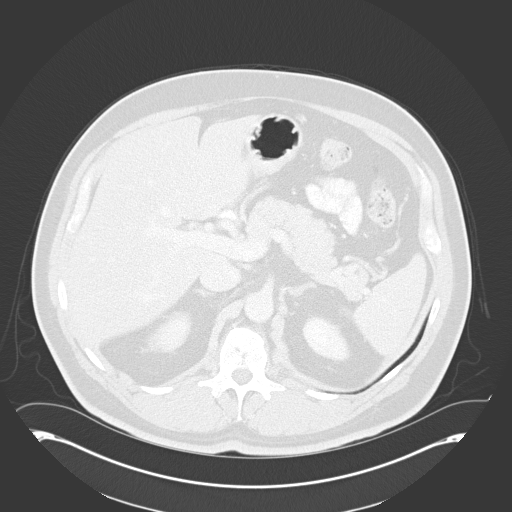
[im 80/92  soft-tissue]
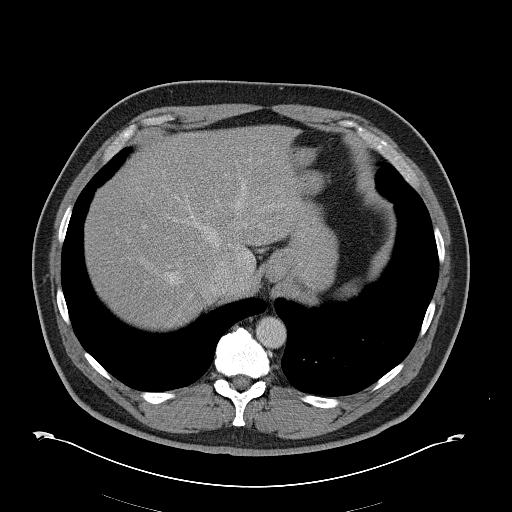
[im 80/92  lung]
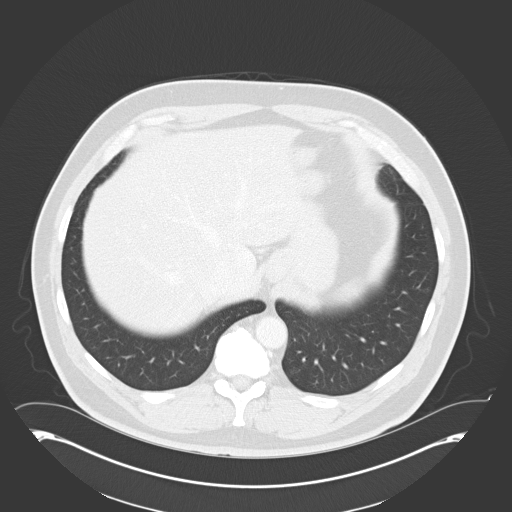

[Series 606: <mpr thick range(4)> · coronal · 0.90mm/px · 1 of 165 slices shown, 2 images]
[im 83/165  soft-tissue]
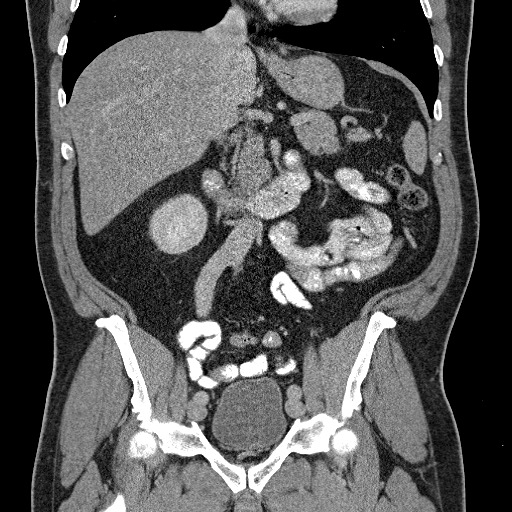
[im 83/165  bone]
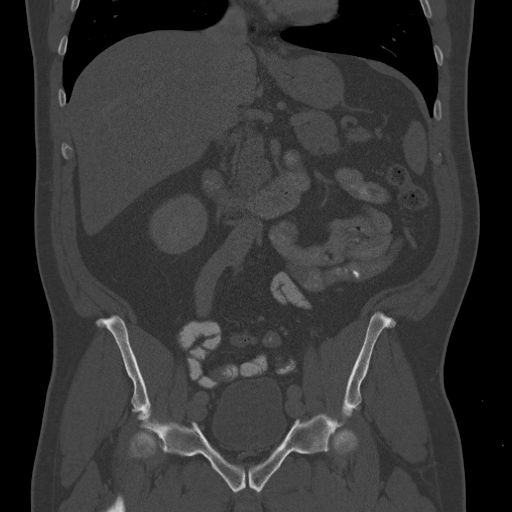

[13 of 46 positions shown; findings below may reference images not displayed]

CLINICAL DATA
History of colon cancer and renal cell carcinoma. Partial left
nephrectomy and colon resection.

EXAM
CT ABDOMEN AND PELVIS WITHOUT AND WITH CONTRAST

TECHNIQUE
Multidetector CT imaging of the abdomen and pelvis was performed
without contrast material in one or both body regions, followed by
contrast material(s) and further sections in one or both body
regions.

CONTRAST
125mL OMNIPAQUE IOHEXOL 300 MG/ML  SOLN

COMPARISON
MRI from 04/13/2013.  CT scan from 03/21/2013.

FINDINGS
4 mm right middle lobe nodule seen on image 3 is unchanged and has
been stable back to 06/16/2012.

No focal abnormality is seen in the liver. 6 mm hypervascular foci
in the dome and anterior spleen are unchanged since the prior CT
scan. The stomach, duodenum, pancreas, gallbladder, and abdominal
bowel loops are unremarkable. There is thickening of both adrenal
glands without adrenal nodular mass. Right kidney is unremarkable.
Defect in the upper pole of the left kidney is compatible with
reported history of partial nephrectomy.

No abdominal aortic aneurysm. There is no lymphadenopathy or free
fluid in the abdomen.

IMPRESSION
Stable CT evaluation of the abdomen. Specifically, no evidence for
recurrent or metastatic disease in this patient status post partial
left nephrectomy.

No focal abnormality within the liver.

Tiny hyper enhancing foci in the spleen are unchanged, likely
secondary to cavernous hemangiomata.

4 mm right middle lobe pulmonary nodule unchanged since 06/16/2012,
likely a benign process such as scarring.

SIGNATURE

## 2015-05-16 ENCOUNTER — Other Ambulatory Visit: Payer: Self-pay | Admitting: Gastroenterology

## 2015-05-18 DIAGNOSIS — Z1283 Encounter for screening for malignant neoplasm of skin: Secondary | ICD-10-CM | POA: Diagnosis not present

## 2015-05-18 DIAGNOSIS — D235 Other benign neoplasm of skin of trunk: Secondary | ICD-10-CM | POA: Diagnosis not present

## 2015-05-18 DIAGNOSIS — D3614 Benign neoplasm of peripheral nerves and autonomic nervous system of thorax: Secondary | ICD-10-CM | POA: Diagnosis not present

## 2015-05-18 DIAGNOSIS — D225 Melanocytic nevi of trunk: Secondary | ICD-10-CM | POA: Diagnosis not present

## 2015-06-28 DIAGNOSIS — C61 Malignant neoplasm of prostate: Secondary | ICD-10-CM | POA: Diagnosis not present

## 2015-07-05 DIAGNOSIS — C61 Malignant neoplasm of prostate: Secondary | ICD-10-CM | POA: Diagnosis not present

## 2015-07-05 DIAGNOSIS — N393 Stress incontinence (female) (male): Secondary | ICD-10-CM | POA: Diagnosis not present

## 2015-07-05 DIAGNOSIS — R310 Gross hematuria: Secondary | ICD-10-CM | POA: Diagnosis not present

## 2015-07-05 DIAGNOSIS — N5201 Erectile dysfunction due to arterial insufficiency: Secondary | ICD-10-CM | POA: Diagnosis not present

## 2015-07-05 DIAGNOSIS — K429 Umbilical hernia without obstruction or gangrene: Secondary | ICD-10-CM | POA: Diagnosis not present

## 2015-07-24 DIAGNOSIS — E7800 Pure hypercholesterolemia, unspecified: Secondary | ICD-10-CM | POA: Diagnosis not present

## 2015-07-24 DIAGNOSIS — I10 Essential (primary) hypertension: Secondary | ICD-10-CM | POA: Diagnosis not present

## 2015-07-24 DIAGNOSIS — N390 Urinary tract infection, site not specified: Secondary | ICD-10-CM | POA: Diagnosis not present

## 2015-07-24 DIAGNOSIS — Z85038 Personal history of other malignant neoplasm of large intestine: Secondary | ICD-10-CM | POA: Diagnosis not present

## 2015-07-24 DIAGNOSIS — Z125 Encounter for screening for malignant neoplasm of prostate: Secondary | ICD-10-CM | POA: Diagnosis not present

## 2015-07-24 DIAGNOSIS — Z23 Encounter for immunization: Secondary | ICD-10-CM | POA: Diagnosis not present

## 2015-07-24 DIAGNOSIS — Z0000 Encounter for general adult medical examination without abnormal findings: Secondary | ICD-10-CM | POA: Diagnosis not present

## 2015-07-30 ENCOUNTER — Encounter: Payer: Self-pay | Admitting: Hematology and Oncology

## 2015-07-30 DIAGNOSIS — Z8701 Personal history of pneumonia (recurrent): Secondary | ICD-10-CM | POA: Diagnosis not present

## 2015-07-30 DIAGNOSIS — G4733 Obstructive sleep apnea (adult) (pediatric): Secondary | ICD-10-CM | POA: Diagnosis not present

## 2015-07-30 DIAGNOSIS — E6609 Other obesity due to excess calories: Secondary | ICD-10-CM | POA: Diagnosis not present

## 2015-07-30 DIAGNOSIS — D3610 Benign neoplasm of peripheral nerves and autonomic nervous system, unspecified: Secondary | ICD-10-CM | POA: Diagnosis not present

## 2015-07-31 ENCOUNTER — Encounter: Payer: Self-pay | Admitting: *Deleted

## 2015-07-31 ENCOUNTER — Encounter: Payer: Self-pay | Admitting: Hematology and Oncology

## 2015-08-18 DIAGNOSIS — J324 Chronic pansinusitis: Secondary | ICD-10-CM | POA: Diagnosis not present

## 2015-08-18 DIAGNOSIS — K122 Cellulitis and abscess of mouth: Secondary | ICD-10-CM | POA: Diagnosis not present

## 2015-10-24 DIAGNOSIS — H401122 Primary open-angle glaucoma, left eye, moderate stage: Secondary | ICD-10-CM | POA: Diagnosis not present

## 2015-10-24 DIAGNOSIS — H401111 Primary open-angle glaucoma, right eye, mild stage: Secondary | ICD-10-CM | POA: Diagnosis not present

## 2016-01-01 ENCOUNTER — Ambulatory Visit (HOSPITAL_BASED_OUTPATIENT_CLINIC_OR_DEPARTMENT_OTHER): Payer: Medicare Other | Admitting: Hematology and Oncology

## 2016-01-01 ENCOUNTER — Encounter: Payer: Self-pay | Admitting: Hematology and Oncology

## 2016-01-01 ENCOUNTER — Other Ambulatory Visit (HOSPITAL_BASED_OUTPATIENT_CLINIC_OR_DEPARTMENT_OTHER): Payer: Medicare Other

## 2016-01-01 ENCOUNTER — Telehealth: Payer: Self-pay | Admitting: Hematology and Oncology

## 2016-01-01 VITALS — BP 145/80 | HR 99 | Temp 98.7°F | Resp 18 | Ht 72.0 in | Wt 271.2 lb

## 2016-01-01 DIAGNOSIS — Z8546 Personal history of malignant neoplasm of prostate: Secondary | ICD-10-CM

## 2016-01-01 DIAGNOSIS — G620 Drug-induced polyneuropathy: Secondary | ICD-10-CM

## 2016-01-01 DIAGNOSIS — Z85528 Personal history of other malignant neoplasm of kidney: Secondary | ICD-10-CM

## 2016-01-01 DIAGNOSIS — C61 Malignant neoplasm of prostate: Secondary | ICD-10-CM | POA: Diagnosis not present

## 2016-01-01 DIAGNOSIS — C649 Malignant neoplasm of unspecified kidney, except renal pelvis: Secondary | ICD-10-CM

## 2016-01-01 DIAGNOSIS — Z515 Encounter for palliative care: Secondary | ICD-10-CM | POA: Diagnosis not present

## 2016-01-01 DIAGNOSIS — Z85038 Personal history of other malignant neoplasm of large intestine: Secondary | ICD-10-CM

## 2016-01-01 DIAGNOSIS — K469 Unspecified abdominal hernia without obstruction or gangrene: Secondary | ICD-10-CM | POA: Insufficient documentation

## 2016-01-01 DIAGNOSIS — K439 Ventral hernia without obstruction or gangrene: Secondary | ICD-10-CM

## 2016-01-01 DIAGNOSIS — G622 Polyneuropathy due to other toxic agents: Secondary | ICD-10-CM

## 2016-01-01 DIAGNOSIS — C189 Malignant neoplasm of colon, unspecified: Secondary | ICD-10-CM | POA: Diagnosis not present

## 2016-01-01 LAB — COMPREHENSIVE METABOLIC PANEL
ALT: 29 U/L (ref 0–55)
AST: 27 U/L (ref 5–34)
Albumin: 4.4 g/dL (ref 3.5–5.0)
Alkaline Phosphatase: 40 U/L (ref 40–150)
Anion Gap: 8 mEq/L (ref 3–11)
BUN: 16.3 mg/dL (ref 7.0–26.0)
CALCIUM: 9.9 mg/dL (ref 8.4–10.4)
CHLORIDE: 103 meq/L (ref 98–109)
CO2: 30 meq/L — AB (ref 22–29)
CREATININE: 1.3 mg/dL (ref 0.7–1.3)
EGFR: 60 mL/min/{1.73_m2} — ABNORMAL LOW (ref >90)
Glucose: 94 mg/dl (ref 70–140)
Potassium: 3.7 mEq/L (ref 3.5–5.1)
SODIUM: 141 meq/L (ref 136–145)
Total Bilirubin: 0.41 mg/dL (ref 0.20–1.20)
Total Protein: 7.8 g/dL (ref 6.4–8.3)

## 2016-01-01 LAB — CBC WITH DIFFERENTIAL/PLATELET
BASO%: 0.4 % (ref 0.0–2.0)
Basophils Absolute: 0 10*3/uL (ref 0.0–0.1)
EOS%: 3.6 % (ref 0.0–7.0)
Eosinophils Absolute: 0.2 10*3/uL (ref 0.0–0.5)
HCT: 44 % (ref 38.4–49.9)
HGB: 15.1 g/dL (ref 13.0–17.1)
LYMPH%: 27.1 % (ref 14.0–49.0)
MCH: 30.7 pg (ref 27.2–33.4)
MCHC: 34.3 g/dL (ref 32.0–36.0)
MCV: 89.4 fL (ref 79.3–98.0)
MONO#: 0.9 10*3/uL (ref 0.1–0.9)
MONO%: 12.9 % (ref 0.0–14.0)
NEUT%: 56 % (ref 39.0–75.0)
NEUTROS ABS: 3.8 10*3/uL (ref 1.5–6.5)
Platelets: 266 10*3/uL (ref 140–400)
RBC: 4.92 10*6/uL (ref 4.20–5.82)
RDW: 13.5 % (ref 11.0–14.6)
WBC: 6.8 10*3/uL (ref 4.0–10.3)
lymph#: 1.8 10*3/uL (ref 0.9–3.3)

## 2016-01-01 LAB — LACTATE DEHYDROGENASE: LDH: 171 U/L (ref 125–245)

## 2016-01-01 NOTE — Progress Notes (Signed)
[facility] OFFICE PROGRESS NOTE  Patient Care Team: Deland Pretty, MD as PCP - General (Internal Medicine) Collene Schlichter, Student-PA as Consulting Physician (Gastroenterology) Alexis Frock, MD as Consulting Physician (Urology)  SUMMARY OF ONCOLOGIC HISTORY:  History of colon cancer, kidney cancer and prostate cancer  HISTORY OF PRESENTING ILLNESS:  Scott Hayden was transferred to my care after his prior physician has left.  I reviewed the patient's records extensive and collaborated the history with the patient. 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 He received 12 cycles of FOLFOX chemotherapy over 6 months. He developed a severe distal neuropathy, upper and lower extremities.  During his initial staging evaluation, CT scan revealed a 2 cm lesion in his left kidney. When he recovered from chemotherapy for the colon cancer he underwent a right robotic partial left nephrectomy 03/21/2010. Pathology showed a 2 cm clear cell grade 2 lesion with no vascular invasion. He has a history of BPH and an elevated PSA. Biopsies in the past were negative for cancer. In June of 2015, he underwent robotic prostatectomy and was noted to have bilateral disease in the prostate. Repeat colonoscopy in November 2015 only show 1 Polyp and biopsy was benign  INTERVAL HISTORY: Please see below for problem oriented charting. He feels well. He is not able to lose further weight since last time I saw him but appears motivated. He is retiring this year when he turned 70. He denies changes in bowel habits. He follows closely with urology. Denies hematuria. He complains of mild abdominal wall  hernia.  REVIEW OF SYSTEMS:   Constitutional: Denies fevers, chills or abnormal weight loss Eyes: Denies blurriness of vision Ears, nose, mouth, throat, and face: Denies mucositis or sore throat Respiratory: Denies cough, dyspnea or wheezes Cardiovascular: Denies palpitation, chest discomfort or lower extremity swelling Gastrointestinal:  Denies nausea, heartburn or change in bowel habits Skin: Denies abnormal skin rashes Lymphatics: Denies new lymphadenopathy or easy bruising Neurological:Denies numbness, tingling or new weaknesses Behavioral/Psych: Mood is stable, no new changes  All other systems were reviewed with the patient and are negative.  I have reviewed the past medical history, past surgical history, social history and family history with the patient and they are unchanged from previous note.  ALLERGIES:  is allergic to shellfish allergy; contrast media; and ultravist.  MEDICATIONS:  Current Outpatient Prescriptions  Medication Sig Dispense Refill  . aspirin 81 MG tablet Take 81 mg by mouth daily.    . calcium carbonate (OS-CAL) 600 MG TABS tablet Take 600 mg by mouth daily with breakfast.    . cetirizine (ZYRTEC) 10 MG tablet Take 10 mg by mouth daily as needed for allergies.    Marland Kitchen esomeprazole (NEXIUM) 40 MG capsule Take 40 mg by mouth every evening.     . fenofibrate (TRICOR) 145 MG tablet Take 145 mg by mouth every evening.     Marland Kitchen FIBER PO Take by mouth daily.    . hydrochlorothiazide (MICROZIDE) 12.5 MG capsule Take 12.5 mg by mouth every morning.     . latanoprost (XALATAN) 0.005 % ophthalmic solution Place 1 drop into both eyes at bedtime.     . metoprolol succinate (TOPROL-XL) 100 MG  24 hr tablet Take 100 mg by mouth every morning. Take with or immediately following a meal.    . Multiple Vitamin (MULTIVITAMIN) tablet Take 1 tablet by mouth daily.     No current facility-administered medications for this visit.    PHYSICAL EXAMINATION: ECOG PERFORMANCE STATUS: 0  - Asymptomatic  Filed Vitals:   01/01/16 1326  BP: 145/80  Pulse: 99  Temp: 98.7 F (37.1 C)  Resp: 18   Filed Weights   01/01/16 1326  Weight: 271 lb 3.2 oz (123.016 kg)    GENERAL:alert, no distress and comfortable. He is morbidly obese SKIN: skin color, texture, turgor are normal, no rashes or significant lesions EYES: normal, Conjunctiva are pink and non-injected, sclera clear OROPHARYNX:no exudate, no erythema and lips, buccal mucosa, and tongue normal  NECK: supple, thyroid normal size, non-tender, without nodularity LYMPH:  no palpable lymphadenopathy in the cervical, axillary or inguinal LUNGS: clear to auscultation and percussion with normal breathing effort HEART: regular rate & rhythm and no murmurs and no lower extremity edema ABDOMEN:abdomen soft, non-tender and normal bowel sounds. Noted hernia at previous surgical site Musculoskeletal:no cyanosis of digits and no clubbing  NEURO: alert & oriented x 3 with fluent speech, no focal motor/sensory deficits  LABORATORY DATA:  I have reviewed the data as listed    Component Value Date/Time   NA 141 01/01/2016 1311   NA 137 01/28/2014 0448   K 3.7 01/01/2016 1311   K 3.3* 01/28/2014 0448   CL 98 01/28/2014 0448   CL 105 12/24/2012 1414   CO2 30* 01/01/2016 1311   CO2 28 01/28/2014 0448   GLUCOSE 94 01/01/2016 1311   GLUCOSE 114* 01/28/2014 0448   GLUCOSE 119* 12/24/2012 1414   BUN 16.3 01/01/2016 1311   BUN 15 01/28/2014 0448   CREATININE 1.3 01/01/2016 1311   CREATININE 1.25 01/28/2014 0448   CALCIUM 9.9 01/01/2016 1311   CALCIUM 8.7 01/28/2014 0448   PROT 7.8 01/01/2016 1311   ALBUMIN 4.4 01/01/2016 1311   AST 27 01/01/2016 1311   ALT 29 01/01/2016 1311   ALKPHOS 40 01/01/2016 1311   BILITOT 0.41 01/01/2016 1311   GFRNONAA 60* 01/28/2014 0448   GFRAA 70* 01/28/2014 0448    No results found for: SPEP, UPEP  Lab Results  Component Value Date   WBC 6.8 01/01/2016   NEUTROABS 3.8 01/01/2016   HGB  15.1 01/01/2016   HCT 44.0 01/01/2016   MCV 89.4 01/01/2016   PLT 266 01/01/2016      Chemistry      Component Value Date/Time   NA 141 01/01/2016 1311   NA 137 01/28/2014 0448   K 3.7 01/01/2016 1311   K 3.3* 01/28/2014 0448   CL 98 01/28/2014 0448   CL 105 12/24/2012 1414   CO2 30* 01/01/2016 1311   CO2 28 01/28/2014 0448   BUN 16.3 01/01/2016 1311   BUN 15 01/28/2014 0448   CREATININE 1.3 01/01/2016 1311   CREATININE 1.25 01/28/2014 0448      Component Value Date/Time   CALCIUM 9.9 01/01/2016 1311   CALCIUM 8.7 01/28/2014 0448   ALKPHOS 40 01/01/2016 1311   AST 27 01/01/2016 1311   ALT 29 01/01/2016 1311   BILITOT 0.41 01/01/2016 1311      ASSESSMENT & PLAN:  History of colon cancer Colonoscopy 2015 was negative for disease recurrence. CEA is pending. Continue surveillance colonoscopy. There is no indication for routine surveillance imaging from now on  History of kidney cancer He  follows with urologist regularly. Reportedly, he had ultrasound done not long ago which is negative for recurrence.    History of prostate cancer Recent PSA was negative and undetectable. Defer to urology for follow-up.    Morbid obesity He made a commitment to lose weight. He appears motivated. 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 Recommend observation only.   Abdominal hernia without obstruction and without gangrene This coincides with prior surgical site. There were no evidence to suggest obstruction. Recommend conservative approach with observation only.  Quality of life palliative care encounter We discussed increase physical activity and possible enrollment with local gym. We discussed importance of aspirin and vitamin D supplementation. I recommend transitioning his care to cancer survivorship clinic and he agreed.   Orders Placed This Encounter  Procedures  . CBC with Differential/Platelet     Standing Status: Future     Number of Occurrences:      Standing Expiration Date: 02/04/2017  . Comprehensive metabolic panel    Standing Status: Future     Number of Occurrences:      Standing Expiration Date: 02/04/2017  . CEA    Standing Status: Future     Number of Occurrences:      Standing Expiration Date: 02/04/2017  . Amb Referral to Survivorship Long term    Referral Priority:  Routine    Referral Type:  Consultation    Referred to Provider:  Holley Bouche, NP    Number of Visits Requested:  1   All questions were answered. The patient knows to call the clinic with any problems, questions or concerns. No barriers to learning was detected. I spent 20 minutes counseling the patient face to face. The total time spent in the appointment was 30 minutes and more than 50% was on counseling and review of test results     [facility], [place], MD 01/01/2016 2:17 PM

## 2016-01-01 NOTE — Assessment & Plan Note (Signed)
He follows with urologist regularly. Reportedly, he had ultrasound done not long ago which is negative for recurrence. 

## 2016-01-01 NOTE — Assessment & Plan Note (Signed)
He made a commitment to lose weight. He appears motivated. I recommend enrollment in local gymnasium

## 2016-01-01 NOTE — Telephone Encounter (Signed)
Gave pt avs,

## 2016-01-01 NOTE — Assessment & Plan Note (Signed)
We discussed increase physical activity and possible enrollment with local gym. We discussed importance of aspirin and vitamin D supplementation. I recommend transitioning his care to cancer survivorship clinic and he agreed.

## 2016-01-01 NOTE — Assessment & Plan Note (Signed)
This coincides with prior surgical site. There were no evidence to suggest obstruction. Recommend conservative approach with observation only.

## 2016-01-01 NOTE — Assessment & Plan Note (Signed)
This is stable. Recommend observation only. 

## 2016-01-01 NOTE — Assessment & Plan Note (Signed)
Recent PSA was negative and undetectable. Defer to urology for follow-up. 

## 2016-01-01 NOTE — Assessment & Plan Note (Signed)
Colonoscopy 2015 was negative for disease recurrence. CEA is pending. Continue surveillance colonoscopy. There is no indication for routine surveillance imaging from now on 

## 2016-01-02 LAB — CEA: CEA: 2.7 ng/mL (ref 0.0–4.7)

## 2016-01-02 LAB — CEA (PARALLEL TESTING): CEA: 2.1 ng/mL

## 2016-01-23 DIAGNOSIS — K219 Gastro-esophageal reflux disease without esophagitis: Secondary | ICD-10-CM | POA: Diagnosis not present

## 2016-01-23 DIAGNOSIS — Z0000 Encounter for general adult medical examination without abnormal findings: Secondary | ICD-10-CM | POA: Diagnosis not present

## 2016-01-23 DIAGNOSIS — N390 Urinary tract infection, site not specified: Secondary | ICD-10-CM | POA: Diagnosis not present

## 2016-01-23 DIAGNOSIS — I10 Essential (primary) hypertension: Secondary | ICD-10-CM | POA: Diagnosis not present

## 2016-01-23 DIAGNOSIS — E7800 Pure hypercholesterolemia, unspecified: Secondary | ICD-10-CM | POA: Diagnosis not present

## 2016-01-28 DIAGNOSIS — E7800 Pure hypercholesterolemia, unspecified: Secondary | ICD-10-CM | POA: Diagnosis not present

## 2016-01-28 DIAGNOSIS — K219 Gastro-esophageal reflux disease without esophagitis: Secondary | ICD-10-CM | POA: Diagnosis not present

## 2016-01-28 DIAGNOSIS — I10 Essential (primary) hypertension: Secondary | ICD-10-CM | POA: Diagnosis not present

## 2016-01-28 DIAGNOSIS — E6609 Other obesity due to excess calories: Secondary | ICD-10-CM | POA: Diagnosis not present

## 2016-04-24 DIAGNOSIS — H401111 Primary open-angle glaucoma, right eye, mild stage: Secondary | ICD-10-CM | POA: Diagnosis not present

## 2016-05-02 DIAGNOSIS — Z23 Encounter for immunization: Secondary | ICD-10-CM | POA: Diagnosis not present

## 2016-05-02 DIAGNOSIS — G4733 Obstructive sleep apnea (adult) (pediatric): Secondary | ICD-10-CM | POA: Diagnosis not present

## 2016-06-30 DIAGNOSIS — C61 Malignant neoplasm of prostate: Secondary | ICD-10-CM | POA: Diagnosis not present

## 2016-07-07 DIAGNOSIS — C61 Malignant neoplasm of prostate: Secondary | ICD-10-CM | POA: Diagnosis not present

## 2016-07-07 DIAGNOSIS — N393 Stress incontinence (female) (male): Secondary | ICD-10-CM | POA: Diagnosis not present

## 2016-07-07 DIAGNOSIS — Z85528 Personal history of other malignant neoplasm of kidney: Secondary | ICD-10-CM | POA: Diagnosis not present

## 2016-07-07 DIAGNOSIS — R350 Frequency of micturition: Secondary | ICD-10-CM | POA: Diagnosis not present

## 2016-08-04 DIAGNOSIS — Z0000 Encounter for general adult medical examination without abnormal findings: Secondary | ICD-10-CM | POA: Diagnosis not present

## 2016-08-04 DIAGNOSIS — Z85528 Personal history of other malignant neoplasm of kidney: Secondary | ICD-10-CM | POA: Diagnosis not present

## 2016-08-04 DIAGNOSIS — E7800 Pure hypercholesterolemia, unspecified: Secondary | ICD-10-CM | POA: Diagnosis not present

## 2016-08-04 DIAGNOSIS — Z125 Encounter for screening for malignant neoplasm of prostate: Secondary | ICD-10-CM | POA: Diagnosis not present

## 2016-08-04 DIAGNOSIS — Z79899 Other long term (current) drug therapy: Secondary | ICD-10-CM | POA: Diagnosis not present

## 2016-08-04 DIAGNOSIS — Z85038 Personal history of other malignant neoplasm of large intestine: Secondary | ICD-10-CM | POA: Diagnosis not present

## 2016-08-04 DIAGNOSIS — I10 Essential (primary) hypertension: Secondary | ICD-10-CM | POA: Diagnosis not present

## 2016-08-04 DIAGNOSIS — N390 Urinary tract infection, site not specified: Secondary | ICD-10-CM | POA: Diagnosis not present

## 2016-08-04 DIAGNOSIS — Z859 Personal history of malignant neoplasm, unspecified: Secondary | ICD-10-CM | POA: Diagnosis not present

## 2016-08-07 DIAGNOSIS — H9319 Tinnitus, unspecified ear: Secondary | ICD-10-CM | POA: Diagnosis not present

## 2016-08-07 DIAGNOSIS — T508X5A Adverse effect of diagnostic agents, initial encounter: Secondary | ICD-10-CM | POA: Diagnosis not present

## 2016-08-07 DIAGNOSIS — I10 Essential (primary) hypertension: Secondary | ICD-10-CM | POA: Diagnosis not present

## 2016-08-07 DIAGNOSIS — H9190 Unspecified hearing loss, unspecified ear: Secondary | ICD-10-CM | POA: Diagnosis not present

## 2016-09-08 ENCOUNTER — Other Ambulatory Visit: Payer: Self-pay | Admitting: Adult Health

## 2016-09-08 ENCOUNTER — Telehealth: Payer: Self-pay | Admitting: Hematology and Oncology

## 2016-09-08 DIAGNOSIS — Z85528 Personal history of other malignant neoplasm of kidney: Secondary | ICD-10-CM

## 2016-09-08 DIAGNOSIS — Z85038 Personal history of other malignant neoplasm of large intestine: Secondary | ICD-10-CM

## 2016-09-08 DIAGNOSIS — Z8546 Personal history of malignant neoplasm of prostate: Secondary | ICD-10-CM

## 2016-09-08 NOTE — Telephone Encounter (Signed)
Left message re May appointments. Schedule mailed.  °

## 2016-10-08 DIAGNOSIS — R32 Unspecified urinary incontinence: Secondary | ICD-10-CM | POA: Diagnosis not present

## 2016-10-08 DIAGNOSIS — R3915 Urgency of urination: Secondary | ICD-10-CM | POA: Diagnosis not present

## 2016-10-22 DIAGNOSIS — H26492 Other secondary cataract, left eye: Secondary | ICD-10-CM | POA: Diagnosis not present

## 2016-10-22 DIAGNOSIS — H401111 Primary open-angle glaucoma, right eye, mild stage: Secondary | ICD-10-CM | POA: Diagnosis not present

## 2016-10-22 DIAGNOSIS — Z961 Presence of intraocular lens: Secondary | ICD-10-CM | POA: Diagnosis not present

## 2016-11-05 ENCOUNTER — Ambulatory Visit (INDEPENDENT_AMBULATORY_CARE_PROVIDER_SITE_OTHER): Payer: Medicare Other | Admitting: Gastroenterology

## 2016-11-05 ENCOUNTER — Encounter: Payer: Self-pay | Admitting: Gastroenterology

## 2016-11-05 VITALS — BP 124/90 | HR 80 | Ht 72.0 in | Wt 277.0 lb

## 2016-11-05 DIAGNOSIS — R194 Change in bowel habit: Secondary | ICD-10-CM

## 2016-11-05 NOTE — Progress Notes (Signed)
Review of pertinent gastrointestinal problems: 1. Sigmoid colon cancer Colonoscopy July 2010 done for "polyp history" by Dr. Nobie Putnam in [place], [place]: Findings sigmoid diverticulosis, internal hemorrhoids, ulcerated polypoid lesion at 20 cm from the anal verge. Biopsies of the 3 cm lesion in sigmoid colon showed adenocarcinoma.  Colonoscopy July 2011, same physician, done for history of colon cancer, findings "all the visualized mucosal surfaces appeared normal except sigmoid diverticulosis, internal hemorrhoids, the anastomosis was normal." Colonoscopy July 2012 by the same physician, done for history of colon cancer, findings "all the visualized mucosal surfaces appeared normal except the following. Sigmoid diverticulosis, the anastomosis was normal at 20 cm, no tumor was seen, positive for internal hemorrhoids.  Colonoscopy Dr. Ardis Hughs 06/2014: normal anastomosis at 20cm.  Left sided diverticulosis, 59mm polyp that was a typical adenoma on path  EGD February 2013 done for GERD, same physician, showed small hiatal hernia, short segment of Barrett's-like changes, multiple polyps in the stomach. Biopsies were negative for H. pylori, they showed no intestinal metaplasia, or polyps were fundic gland polyps.   HPI: This is a    very pleasant 66 year old man whom I last saw 2-3 years ago the time of colonoscopy. See those results above.  Chief complaint is change in bowels  Looser than usual stools.  He has difficult to move stools, then loose stools. These changes started around the time of vacation.  BEfore these changes he was having daily BMs, once per day.  Lately things have been different, more solid at times.No bleeding.    Overall he's gained about 5 pounds.  Prostate cancer: treated with prostatectomy 2015  ROS: complete GI ROS as described in HPI.  Constitutional:  No unintentional weight loss   Past Medical History:  Diagnosis Date  . Allergy   . BPH (benign prostatic hypertrophy)   .  Cancer ([place])    colon cancer 2010, kidney cancer 2010  . 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 Colon cancer ([place])   . Elevated PSA   . GERD (gastroesophageal reflux disease)   . Glaucoma 06/09/2012  . Heart murmur    as a child   . History of colon cancer 05/31/2012  . History of colon cancer, stage III ONCOLOGIST--  DR Allayne Gitelman--  NO RECURRENCE   SIGMOID COLON CA  STAGE IIIA ,  T2, N1--  S/P COLONECTOMY AND CHEMOTHERAPY  . History of colon polyps   . History of gastric polyp   . History of kidney cancer 05/31/2012  . History of prostate cancer 01/27/2014  . History of renal cell carcinoma 05-31-2012   STAGE I  GRADE 2  FUHRMAN--  S/P PARTIAL LEFT NEPHRECOTMY WITH NEGATIVE MARGINS  . Hyperlipidemia   . Hypertension   . 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 OSA on CPAP    cpap settings- 10   . Pneumonia    hx of   . Prostate cancer ([place]) 2015   s/p prostatectomy  . Pulmonary nodule    RIGHT MIDDLE LOBE PER CT 10-26-2013  STABLE  . Sleep apnea    cpap  . Wears glasses   . Wears hearing aid    BILATERAL    Past Surgical History:  Procedure Laterality Date  . CATARACT EXTRACTION W/ INTRAOCULAR LENS  IMPLANT, BILATERAL  04/2011 & 12/2011  . INGUINAL HERNIA REPAIR Left 01/1977  . KNEE ARTHROSCOPY Left 09/2007  .  LAPAROSCOPIC SIGMOID COLECTOMY  03/09/2009  ([place]  . LYMPHADENECTOMY Bilateral 01/27/2014   Procedure: BILATERAL PELVIC LYMPH NODE DISSECTION;  Surgeon: Alexis Frock, MD;  Location: [facility];  Service: Urology;  Laterality: Bilateral;  . PROSTATE BIOPSY N/A 11/30/2013   Procedure: SATURATION BIOPSY TRANSRECTAL ULTRASONIC PROSTATE (TUBP);  Surgeon: Alexis Frock, MD;  Location: [facility];  Service: Urology;  Laterality: N/A;  . ROBOT ASSISTED LAPAROSCOPIC RADICAL PROSTATECTOMY N/A 01/27/2014   Procedure: ROBOTIC ASSISTED LAPAROSCOPIC  RADICAL PROSTATECTOMY WITH INDOCYANINE GREEN DYE,EXTENSIVE ADHESIOLYSIS " PRE -PERITONEAL";  Surgeon: Alexis Frock, MD;  Location: [facility];  Service: Urology;  Laterality: N/A;  . ROBOTIC ASSITED PARTIAL NEPHRECTOMY Left 03-21-2012    Current Outpatient Prescriptions  Medication Sig Dispense Refill  . aspirin 81 MG tablet Take 81 mg by mouth daily.    . calcium carbonate (OS-CAL) 600 MG TABS tablet Take 600 mg by mouth daily with breakfast.    . cetirizine (ZYRTEC) 10 MG tablet Take 10 mg by mouth daily as needed for allergies.    Marland Kitchen esomeprazole (NEXIUM) 40 MG capsule Take 40 mg by mouth every evening.     . fenofibrate (TRICOR) 145 MG tablet Take 145 mg by mouth every evening.     . hydrochlorothiazide (MICROZIDE) 12.5 MG capsule Take 12.5 mg by mouth every morning.     . latanoprost (XALATAN) 0.005 % ophthalmic solution Place 1 drop into both eyes at bedtime.     . metoprolol succinate (TOPROL-XL) 100 MG 24 hr tablet Take 100 mg by mouth every morning. Take with or immediately following a meal.    . Multiple Vitamin (MULTIVITAMIN) tablet Take 1 tablet by mouth daily.    Marland Kitchen oxybutynin (DITROPAN) 5 MG tablet Take 1-2 tablets by mouth daily.     No current facility-administered medications for this visit.     Allergies as of 11/05/2016 - Review Complete 11/05/2016  Allergen Reaction Noted  . Shellfish allergy Swelling 06/09/2012  . Contrast media [iodinated diagnostic agents] Rash 06/16/2012  . Ultravist [iopromide] Rash 06/09/2012    Family History  Problem Relation Age of Onset  . Stomach cancer Mother 88  . Hodgkin's lymphoma Father   . Prostate cancer Father   . Colon cancer Neg Hx   . Rectal cancer Neg Hx   . Esophageal cancer Neg Hx     Social History   Social History  . Marital status: Married    Spouse name: Thayer Headings  . Number of children: 2  . Years of education: MS+   Occupational History  . Retired    Social History Main Topics  . Smoking status: Never Smoker   . Smokeless tobacco: Never Used  . Alcohol use 0.0 oz/week     Comment: RARE  . Drug use: No  . Sexual activity: Not on file   Other Topics Concern  . Not on file   Social History Narrative   Pt lives at home with spouse.    Caffeine Use: 2-3 cups daily.      Physical Exam: BP 124/90   Pulse 80   Ht 6' (1.829 m)   Wt 277 lb (125.6 kg)   BMI 37.57 kg/m  Constitutional: generally well-appearing Psychiatric: alert and oriented x3 Abdomen: soft, nontender, nondistended, no obvious ascites, no peritoneal signs, normal bowel sounds No peripheral edema noted in lower extremities  Assessment and plan: 66 y.o. male with change in bowel habits  He noticed this change while on vacation about 2 months ago. He  was quite concerned at first with very erratic loose stools then constipation. These changes have gradually improved however he has not yet back to normal. He cannot point to any specific vomiting or diarrheal illness that started all this. He is having no overt bleeding. I'm not sure what caused his mild bowel changes but I think it is unlikely that it is recurrent cancer. He has had no overt bleeding, his CEA has been normal, he has had no weight changes. I recommended we try to improve his bowel habits with fiber supplements which she will try over the next several weeks. He will call to report on his response. If he is not had a liter, near-complete or complete improvement in his bowel habits that I would probably recommend repeating colonoscopy at that point since he has had a cancer in his sigmoid many years ago. He will otherwise be due for surveillance colonoscopy about 2-1/2 years from now.  Please see the "Patient Instructions" section for addition details about the plan.  Owens Loffler, MD [facility] 11/05/2016, 2:10 PM

## 2016-11-05 NOTE — Patient Instructions (Signed)
Please start taking citrucel (orange flavored) powder fiber supplement.  This may cause some bloating at first but that usually goes away. Begin with a small spoonful and work your way up to a large, heaping spoonful daily over a week. Call in 5-6 weeks to report on your response.

## 2016-12-25 ENCOUNTER — Other Ambulatory Visit (HOSPITAL_BASED_OUTPATIENT_CLINIC_OR_DEPARTMENT_OTHER): Payer: Medicare Other

## 2016-12-25 DIAGNOSIS — Z8546 Personal history of malignant neoplasm of prostate: Secondary | ICD-10-CM | POA: Diagnosis present

## 2016-12-25 DIAGNOSIS — Z85528 Personal history of other malignant neoplasm of kidney: Secondary | ICD-10-CM | POA: Diagnosis not present

## 2016-12-25 DIAGNOSIS — Z85038 Personal history of other malignant neoplasm of large intestine: Secondary | ICD-10-CM

## 2016-12-25 LAB — CBC WITH DIFFERENTIAL/PLATELET
BASO%: 0.5 % (ref 0.0–2.0)
Basophils Absolute: 0 10*3/uL (ref 0.0–0.1)
EOS%: 4.1 % (ref 0.0–7.0)
Eosinophils Absolute: 0.3 10*3/uL (ref 0.0–0.5)
HCT: 43.5 % (ref 38.4–49.9)
HGB: 14.8 g/dL (ref 13.0–17.1)
LYMPH%: 31.3 % (ref 14.0–49.0)
MCH: 30.7 pg (ref 27.2–33.4)
MCHC: 34.1 g/dL (ref 32.0–36.0)
MCV: 90.1 fL (ref 79.3–98.0)
MONO#: 0.7 10*3/uL (ref 0.1–0.9)
MONO%: 10.4 % (ref 0.0–14.0)
NEUT%: 53.7 % (ref 39.0–75.0)
NEUTROS ABS: 3.7 10*3/uL (ref 1.5–6.5)
Platelets: 246 10*3/uL (ref 140–400)
RBC: 4.83 10*6/uL (ref 4.20–5.82)
RDW: 13.9 % (ref 11.0–14.6)
WBC: 6.9 10*3/uL (ref 4.0–10.3)
lymph#: 2.2 10*3/uL (ref 0.9–3.3)

## 2016-12-25 LAB — COMPREHENSIVE METABOLIC PANEL
ALT: 33 U/L (ref 0–55)
ANION GAP: 9 meq/L (ref 3–11)
AST: 32 U/L (ref 5–34)
Albumin: 4.4 g/dL (ref 3.5–5.0)
Alkaline Phosphatase: 39 U/L — ABNORMAL LOW (ref 40–150)
BUN: 15.6 mg/dL (ref 7.0–26.0)
CHLORIDE: 102 meq/L (ref 98–109)
CO2: 30 meq/L — AB (ref 22–29)
CREATININE: 1.2 mg/dL (ref 0.7–1.3)
Calcium: 9.8 mg/dL (ref 8.4–10.4)
EGFR: 66 mL/min/{1.73_m2} — ABNORMAL LOW (ref >90)
GLUCOSE: 104 mg/dL (ref 70–140)
Potassium: 3.3 mEq/L — ABNORMAL LOW (ref 3.5–5.1)
SODIUM: 141 meq/L (ref 136–145)
Total Bilirubin: 0.8 mg/dL (ref 0.20–1.20)
Total Protein: 7.5 g/dL (ref 6.4–8.3)

## 2016-12-25 LAB — LACTATE DEHYDROGENASE: LDH: 178 U/L (ref 125–245)

## 2017-01-01 ENCOUNTER — Ambulatory Visit (HOSPITAL_BASED_OUTPATIENT_CLINIC_OR_DEPARTMENT_OTHER): Payer: Medicare Other | Admitting: Adult Health

## 2017-01-01 ENCOUNTER — Encounter: Payer: Self-pay | Admitting: Adult Health

## 2017-01-01 ENCOUNTER — Ambulatory Visit (HOSPITAL_BASED_OUTPATIENT_CLINIC_OR_DEPARTMENT_OTHER): Payer: Medicare Other

## 2017-01-01 VITALS — BP 143/85 | HR 93 | Temp 98.2°F | Resp 18 | Ht 72.0 in | Wt 276.4 lb

## 2017-01-01 DIAGNOSIS — Z85528 Personal history of other malignant neoplasm of kidney: Secondary | ICD-10-CM | POA: Diagnosis not present

## 2017-01-01 DIAGNOSIS — Z8546 Personal history of malignant neoplasm of prostate: Secondary | ICD-10-CM

## 2017-01-01 DIAGNOSIS — Z85038 Personal history of other malignant neoplasm of large intestine: Secondary | ICD-10-CM

## 2017-01-01 NOTE — Progress Notes (Signed)
CLINIC:  Survivorship   REASON FOR VISIT:  Routine follow-up for history of colon cancer cancer.   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 He received 12 cycles of FOLFOX chemotherapy over 6 months. He developed a severe distal neuropathy, upper and lower extremities.  During his initial staging evaluation, CT scan revealed a 2 cm lesion in his left kidney. When he recovered from chemotherapy for the colon cancer he underwent a right robotic partial left nephrectomy 03/21/2010. Pathology showed a 2 cm clear cell grade 2 lesion with no vascular invasion. He has a history of BPH and an elevated PSA. Biopsies in the past were negative for cancer. In June of 2015, he underwent robotic prostatectomy and was noted to have bilateral disease in the prostate. Repeat colonoscopy in November 2015 only show 1 Polyp and biopsy was benign  INTERVAL HISTORY:  Scott Hayden presents to the [facility] today for routine follow-up for his history of colon cancer.  Overall, he reports feeling quite well. He did have some bowel changes and saw Dr. Ardis Hughs.  He was instructed to take fiber which has helped.  Last colonoscopy 07/05/2014.  He is to have these done every 5 years, next due in 06/2019.  He also has prostate cancer and has undergone prostatectomy.  He has had some incontinence.  He admits he should do more kegel exercises.  He follows with Dr. Tresa Moore at [facility] who follows his PSA.  He also has h/o kidney cancer that was detected prior to colon surgery.  He had left nephrectomy and this is followed by Dr. Tresa Moore as well.  He is concerned about a hernia from his prostate surgery.      REVIEW OF SYSTEMS:    Review of Systems  Constitutional: Negative for appetite change, chills, diaphoresis, fatigue, fever and unexpected weight change.  HENT:   Negative for hearing loss and lump/mass.   Eyes: Negative for eye problems and icterus.  Respiratory: Negative for chest tightness, cough and shortness of breath.   Cardiovascular: Negative for chest pain, leg swelling and palpitations.  Gastrointestinal: Negative for abdominal distention, abdominal pain, constipation, diarrhea, nausea, rectal pain and vomiting.  Endocrine: Negative for hot flashes.  Genitourinary: Positive for bladder incontinence (related to prostate cancer). Negative for difficulty urinating.   Musculoskeletal: Negative for arthralgias and neck pain.  Skin: Negative for itching and rash.  Neurological: Positive for numbness (peripheral neuropathy from chemotherapy). Negative for dizziness and headaches.  Hematological: Negative for adenopathy. Does not bruise/bleed easily.  Psychiatric/Behavioral: Negative for depression. The patient is not nervous/anxious.             PAST MEDICAL/SURGICAL HISTORY:  Past Medical History:  Diagnosis Date  . Allergy   . BPH (benign prostatic hypertrophy)   . Cancer ([place])    colon cancer 2010, kidney cancer 2010  . 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 Colon cancer ([place])   . Elevated PSA   . GERD (gastroesophageal reflux disease)   . Glaucoma 06/09/2012  . Heart murmur    as a child   . History of colon cancer 05/31/2012  . History of colon cancer,  stage III ONCOLOGIST--  DR Allayne Gitelman--  NO RECURRENCE   SIGMOID COLON CA  STAGE IIIA ,  T2, N1--  S/P COLONECTOMY AND CHEMOTHERAPY  . History of colon polyps   . History of gastric polyp   . History of kidney cancer 05/31/2012  . History of prostate cancer 01/27/2014  . History of renal cell carcinoma 05-31-2012   STAGE I  GRADE 2  FUHRMAN--  S/P PARTIAL  LEFT NEPHRECOTMY WITH NEGATIVE MARGINS  . Hyperlipidemia   . Hypertension   . 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 OSA on CPAP    cpap settings- 10   . Pneumonia    hx of   . Prostate cancer ([place]) 2015   s/p prostatectomy  . Pulmonary nodule    RIGHT MIDDLE LOBE PER CT 10-26-2013  STABLE  . Sleep apnea    cpap  . Wears glasses   . Wears hearing aid    BILATERAL   Past Surgical History:  Procedure Laterality Date  . CATARACT EXTRACTION W/ INTRAOCULAR LENS  IMPLANT, BILATERAL  04/2011 & 12/2011  . INGUINAL HERNIA REPAIR Left 01/1977  . KNEE ARTHROSCOPY Left 09/2007  . LAPAROSCOPIC SIGMOID COLECTOMY  03/09/2009  ([place])  . LYMPHADENECTOMY Bilateral 01/27/2014   Procedure: BILATERAL PELVIC LYMPH NODE DISSECTION;  Surgeon: Alexis Frock, MD;  Location: [facility];  Service: Urology;  Laterality: Bilateral;  . PROSTATE BIOPSY N/A 11/30/2013   Procedure: SATURATION BIOPSY TRANSRECTAL ULTRASONIC PROSTATE (TUBP);  Surgeon: Alexis Frock, MD;  Location: [facility];  Service: Urology;  Laterality: N/A;  . ROBOT ASSISTED LAPAROSCOPIC RADICAL PROSTATECTOMY N/A 01/27/2014   Procedure: ROBOTIC ASSISTED LAPAROSCOPIC RADICAL PROSTATECTOMY WITH INDOCYANINE GREEN DYE,EXTENSIVE ADHESIOLYSIS " PRE -PERITONEAL";  Surgeon: Alexis Frock, MD;  Location: [facility];  Service: Urology;  Laterality: N/A;  . ROBOTIC ASSITED PARTIAL NEPHRECTOMY Left 03-21-2012     ALLERGIES:  Allergies  Allergen Reactions  . Shellfish Allergy Swelling    Eye puffiness  . Contrast Media [Iodinated Diagnostic Agents] Rash    Erythema on chest w/ scans in [place], pt did fine with 13hr prep  . Ultravist [Iopromide] Rash    Rash on chest     CURRENT MEDICATIONS:  Outpatient Encounter Prescriptions as of 01/01/2017  Medication Sig  . aspirin 81 MG tablet Take 81 mg by mouth daily.  . calcium carbonate (OS-CAL) 600 MG TABS tablet Take 600 mg by mouth daily with breakfast.  .  cetirizine (ZYRTEC) 10 MG tablet Take 10 mg by mouth daily as needed for allergies.  Marland Kitchen esomeprazole (NEXIUM) 40 MG capsule Take 40 mg by mouth every evening.   . fenofibrate (TRICOR) 145 MG tablet Take 145 mg by mouth every evening.   . hydrochlorothiazide (MICROZIDE) 12.5 MG capsule Take 12.5 mg by mouth every morning.   . latanoprost (XALATAN) 0.005 % ophthalmic solution Place 1 drop into both eyes at bedtime.   . metoprolol succinate (TOPROL-XL) 100 MG 24 hr tablet Take 100 mg by mouth every morning. Take with or immediately following a meal.  . Multiple Vitamin (MULTIVITAMIN) tablet Take 1 tablet by mouth daily.  Marland Kitchen oxybutynin (DITROPAN) 5 MG tablet Take 1-2 tablets by mouth daily.   No facility-administered encounter medications on file as of 01/01/2017.      ONCOLOGIC FAMILY HISTORY:  Family History  Problem Relation Age of Onset  . Stomach cancer Mother 84  . Hodgkin's lymphoma Father   . Prostate cancer Father   .  Colon cancer Neg Hx   . Rectal cancer Neg Hx   . Esophageal cancer Neg Hx    Genetics: no mutations identified   SOCIAL HISTORY:  Scott Hayden is married and lives with his wife in [place], [place].  Mr. Sedore  has 2 daughters and they live in [place] and the other in [place].  Currently retired.  Denies any current or history of tobacco, alcohol, or illicit drug use.    PHYSICAL EXAMINATION:  Vital Signs: Vitals:   01/01/17 1413  BP: (!) 143/85  Pulse: 93  Resp: 18  Temp: 98.2 F (36.8 C)   Filed Weights   01/01/17 1413  Weight: 276 lb 6.4 oz (125.4 kg)   General: Well-nourished, well-appearing male in no acute distress. Unaccompanied today. HEENT: Head is normocephalic.  Pupils equal and reactive to light. Conjunctivae clear without exudate.  Sclerae anicteric. Oral mucosa is pink, moist.  Oropharynx is pink without lesions or erythema.  Lymph: No cervical, supraclavicular, infraclavicular, or axillary lymphadenopathy noted on palpation.    Cardiovascular: Regular rate and rhythm.Marland Kitchen Respiratory: Clear to auscultation bilaterally. Chest expansion symmetric; breathing non-labored.  GI: Abdomen soft and round; non-tender, non-distended. Bowel sounds normoactive. No hepatosplenomegaly.   GU: Deferred.  Neuro: No focal deficits. Steady gait.  Psych: Mood and affect normal and appropriate for situation.  Extremities: No edema. Skin: Warm and dry.   LABORATORY DATA:  None for this visit  DIAGNOSTIC IMAGING:  None for this visit     ASSESSMENT AND PLAN:  Scott Hayden is a pleasant 66 y.o. male with history of Stage IIIA colon cancer, diagnosed in 2010; treated with colectomy and 12 cycles of FOLFOX chemotherapy. Scott Hayden presents to the [facility] for surveillance and routine follow-up.   1. History of Stage IIIA colon cancer:  Scott Hayden is currently clinically without evidence of colon cancer. She will follow-up in the Survivorship Clinic in 1 year with labs, history, and physical exam per surveillance protocol.  I encouraged him to call me with any questions or concerns before his next visit at the cancer center, and I would be happy to see the patient sooner, if needed.    2. Cancer screenings:  Due to Scott Hayden history and age, he should receive screening for skin cancers.  He should follow up with GI and urology for his colonoscopy, and prostate checks as indicated.. The patient was encouraged to follow-up with his PCP for appropriate cancer screenings.   3. Health maintenance and wellness promotion: Scott Hayden was encouraged to consume 5-7 servings of fruits and vegetables per day. The patient was also encouraged to engage in moderate to vigorous exercise for 30 minutes per day most days of the week. Scott Hayden was instructed to limit her alcohol consumption and continue to abstain from tobacco use.    Dispo:  -Return to cancer center to see Survivorship NP in one year   A total of (#) minutes of face-to-face time was  spent with this patient with greater than 50% of that time in counseling and care-coordination.   Gardenia Phlegm, NP Survivorship Program [place] [phone]   Note: PRIMARY CARE PROVIDER Deland Pretty, [place] [phone]

## 2017-01-02 LAB — CEA (IN HOUSE-CHCC): CEA (CHCC-In House): 3.02 ng/mL (ref 0.00–5.00)

## 2017-01-02 LAB — CEA: CEA1: 2.7 ng/mL (ref 0.0–4.7)

## 2017-01-05 ENCOUNTER — Telehealth: Payer: Self-pay | Admitting: *Deleted

## 2017-01-05 NOTE — Telephone Encounter (Signed)
Per Mendel Ryder, NP made patient aware lab results for CEA was normal. Patient verbalized understanding, and appreciation for the call.

## 2017-04-29 DIAGNOSIS — H401111 Primary open-angle glaucoma, right eye, mild stage: Secondary | ICD-10-CM | POA: Diagnosis not present

## 2017-06-30 DIAGNOSIS — C61 Malignant neoplasm of prostate: Secondary | ICD-10-CM | POA: Diagnosis not present

## 2017-07-07 DIAGNOSIS — R3915 Urgency of urination: Secondary | ICD-10-CM | POA: Diagnosis not present

## 2017-07-07 DIAGNOSIS — N5201 Erectile dysfunction due to arterial insufficiency: Secondary | ICD-10-CM | POA: Diagnosis not present

## 2017-07-07 DIAGNOSIS — C649 Malignant neoplasm of unspecified kidney, except renal pelvis: Secondary | ICD-10-CM | POA: Diagnosis not present

## 2017-07-07 DIAGNOSIS — C61 Malignant neoplasm of prostate: Secondary | ICD-10-CM | POA: Diagnosis not present

## 2017-08-06 DIAGNOSIS — I10 Essential (primary) hypertension: Secondary | ICD-10-CM | POA: Diagnosis not present

## 2017-08-06 DIAGNOSIS — Z0000 Encounter for general adult medical examination without abnormal findings: Secondary | ICD-10-CM | POA: Diagnosis not present

## 2017-08-06 DIAGNOSIS — Z125 Encounter for screening for malignant neoplasm of prostate: Secondary | ICD-10-CM | POA: Diagnosis not present

## 2017-08-06 DIAGNOSIS — E7800 Pure hypercholesterolemia, unspecified: Secondary | ICD-10-CM | POA: Diagnosis not present

## 2017-08-12 DIAGNOSIS — Z8546 Personal history of malignant neoplasm of prostate: Secondary | ICD-10-CM | POA: Diagnosis not present

## 2017-08-12 DIAGNOSIS — L820 Inflamed seborrheic keratosis: Secondary | ICD-10-CM | POA: Diagnosis not present

## 2017-08-12 DIAGNOSIS — D3610 Benign neoplasm of peripheral nerves and autonomic nervous system, unspecified: Secondary | ICD-10-CM | POA: Diagnosis not present

## 2017-08-12 DIAGNOSIS — K219 Gastro-esophageal reflux disease without esophagitis: Secondary | ICD-10-CM | POA: Diagnosis not present

## 2017-08-12 DIAGNOSIS — H9319 Tinnitus, unspecified ear: Secondary | ICD-10-CM | POA: Diagnosis not present

## 2017-08-12 DIAGNOSIS — I10 Essential (primary) hypertension: Secondary | ICD-10-CM | POA: Diagnosis not present

## 2017-08-12 DIAGNOSIS — R350 Frequency of micturition: Secondary | ICD-10-CM | POA: Diagnosis not present

## 2017-08-12 DIAGNOSIS — E782 Mixed hyperlipidemia: Secondary | ICD-10-CM | POA: Diagnosis not present

## 2017-08-12 DIAGNOSIS — K439 Ventral hernia without obstruction or gangrene: Secondary | ICD-10-CM | POA: Diagnosis not present

## 2017-08-12 DIAGNOSIS — Z6838 Body mass index (BMI) 38.0-38.9, adult: Secondary | ICD-10-CM | POA: Diagnosis not present

## 2017-08-12 DIAGNOSIS — R7303 Prediabetes: Secondary | ICD-10-CM | POA: Diagnosis not present

## 2017-08-12 DIAGNOSIS — G629 Polyneuropathy, unspecified: Secondary | ICD-10-CM | POA: Diagnosis not present

## 2017-09-16 DIAGNOSIS — G4733 Obstructive sleep apnea (adult) (pediatric): Secondary | ICD-10-CM | POA: Diagnosis not present

## 2017-10-28 DIAGNOSIS — H401111 Primary open-angle glaucoma, right eye, mild stage: Secondary | ICD-10-CM | POA: Diagnosis not present

## 2017-10-28 DIAGNOSIS — Z961 Presence of intraocular lens: Secondary | ICD-10-CM | POA: Diagnosis not present

## 2017-10-28 DIAGNOSIS — H26492 Other secondary cataract, left eye: Secondary | ICD-10-CM | POA: Diagnosis not present

## 2017-12-14 DIAGNOSIS — G4733 Obstructive sleep apnea (adult) (pediatric): Secondary | ICD-10-CM | POA: Diagnosis not present

## 2017-12-30 DIAGNOSIS — R69 Illness, unspecified: Secondary | ICD-10-CM | POA: Diagnosis not present

## 2018-01-04 ENCOUNTER — Other Ambulatory Visit: Payer: Self-pay | Admitting: Adult Health

## 2018-01-04 DIAGNOSIS — Z85528 Personal history of other malignant neoplasm of kidney: Secondary | ICD-10-CM

## 2018-01-04 DIAGNOSIS — Z85038 Personal history of other malignant neoplasm of large intestine: Secondary | ICD-10-CM

## 2018-01-04 DIAGNOSIS — Z8546 Personal history of malignant neoplasm of prostate: Secondary | ICD-10-CM

## 2018-01-05 ENCOUNTER — Inpatient Hospital Stay: Payer: Medicare HMO | Attending: Hematology and Oncology

## 2018-01-05 DIAGNOSIS — Z85528 Personal history of other malignant neoplasm of kidney: Secondary | ICD-10-CM | POA: Diagnosis not present

## 2018-01-05 DIAGNOSIS — Z85038 Personal history of other malignant neoplasm of large intestine: Secondary | ICD-10-CM | POA: Diagnosis not present

## 2018-01-05 DIAGNOSIS — Z79899 Other long term (current) drug therapy: Secondary | ICD-10-CM | POA: Insufficient documentation

## 2018-01-05 DIAGNOSIS — Z8546 Personal history of malignant neoplasm of prostate: Secondary | ICD-10-CM | POA: Insufficient documentation

## 2018-01-05 LAB — CMP (CANCER CENTER ONLY)
ALBUMIN: 4.3 g/dL (ref 3.5–5.0)
ALT: 24 U/L (ref 0–55)
ANION GAP: 8 (ref 3–11)
AST: 26 U/L (ref 5–34)
Alkaline Phosphatase: 37 U/L — ABNORMAL LOW (ref 40–150)
BILIRUBIN TOTAL: 0.5 mg/dL (ref 0.2–1.2)
BUN: 13 mg/dL (ref 7–26)
CO2: 28 mmol/L (ref 22–29)
Calcium: 9.6 mg/dL (ref 8.4–10.4)
Chloride: 102 mmol/L (ref 98–109)
Creatinine: 1 mg/dL (ref 0.70–1.30)
GFR, Est AFR Am: >60 mL/min (ref >60)
GFR, Estimated: >60 mL/min (ref >60)
GLUCOSE: 114 mg/dL (ref 70–140)
POTASSIUM: 3.7 mmol/L (ref 3.5–5.1)
SODIUM: 138 mmol/L (ref 136–145)
Total Protein: 7.3 g/dL (ref 6.4–8.3)

## 2018-01-05 LAB — LACTATE DEHYDROGENASE: LDH: 171 U/L (ref 125–245)

## 2018-01-05 LAB — CBC WITH DIFFERENTIAL (CANCER CENTER ONLY)
BASOS ABS: 0 10*3/uL (ref 0.0–0.1)
Basophils Relative: 0 %
EOS PCT: 3 %
Eosinophils Absolute: 0.2 10*3/uL (ref 0.0–0.5)
HEMATOCRIT: 42.4 % (ref 38.4–49.9)
Hemoglobin: 14.2 g/dL (ref 13.0–17.1)
LYMPHS PCT: 27 %
Lymphs Abs: 1.8 10*3/uL (ref 0.9–3.3)
MCH: 30.7 pg (ref 27.2–33.4)
MCHC: 33.5 g/dL (ref 32.0–36.0)
MCV: 91.6 fL (ref 79.3–98.0)
MONO ABS: 0.8 10*3/uL (ref 0.1–0.9)
Monocytes Relative: 13 %
NEUTROS ABS: 3.7 10*3/uL (ref 1.5–6.5)
Neutrophils Relative %: 57 %
PLATELETS: 242 10*3/uL (ref 140–400)
RBC: 4.63 MIL/uL (ref 4.20–5.82)
RDW: 13.7 % (ref 11.0–14.6)
WBC Count: 6.4 10*3/uL (ref 4.0–10.3)

## 2018-01-11 ENCOUNTER — Encounter: Payer: Self-pay | Admitting: Adult Health

## 2018-01-11 NOTE — Progress Notes (Signed)
CLINIC:  Survivorship   REASON FOR VISIT:  Routine follow-up for history of colon cancer cancer.   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 He received 12 cycles of FOLFOX chemotherapy over 6 months. He developed a severe distal neuropathy, upper and lower extremities.  During his initial staging evaluation, CT scan revealed a 2 cm lesion in his left kidney. When he recovered from chemotherapy for the colon cancer he underwent a right robotic partial left nephrectomy 03/21/2010. Pathology showed a 2 cm clear cell grade 2 lesion with no vascular invasion. He has a history of BPH and an elevated PSA. Biopsies in the past were negative for cancer. In June of 2015, he underwent robotic prostatectomy and was noted to have bilateral disease in the prostate. Repeat colonoscopy in November 2015 only show 1 Polyp and biopsy was benign  INTERVAL HISTORY:  Scott Hayden presents to the [facility] today for routine follow-up for his history of colon cancer.  Overall, he reports feeling quite well. His last colonoscopy was on 07/05/2014.  He is to have these done every 5 years, next due in 06/2019.  He also has prostate cancer and has undergone prostatectomy.  He has had some mild incontinence issues.  This is managed with Ditropan, which helps.  He follows with Dr. Tresa Moore at [facility] who follows his PSA.  Marland Kitchen  He also has h/o kidney cancer that was detected prior to colon surgery.  He had left nephrectomy and this is followed by Dr. Tresa Moore as well.  He last saw Dr. Tresa Moore in 06/2017 and sees him annually.    Scott Hayden enjoys Animal nutritionist classes and being invovled with church.  He is enjoying his retirement.  REVIEW OF  SYSTEMS:  Review of Systems  Constitutional: Positive for unexpected weight change (intentionally losing weight). Negative for appetite change, chills, fatigue and fever.  HENT:   Negative for hearing loss, lump/mass and trouble swallowing.   Eyes: Negative for eye problems and icterus.  Respiratory: Negative for chest tightness, cough and shortness of breath.   Cardiovascular: Negative for chest pain, leg swelling and palpitations.  Gastrointestinal: Negative for abdominal distention, abdominal pain, constipation, diarrhea, nausea and vomiting.  Endocrine: Negative for hot flashes.  Skin: Negative for itching and rash.  Neurological: Negative for dizziness, extremity weakness, headaches and numbness.  Hematological: Negative for adenopathy. Does not bruise/bleed easily.  Psychiatric/Behavioral: Negative for depression. The patient is not nervous/anxious.       PAST MEDICAL/SURGICAL HISTORY:  Past Medical History:  Diagnosis Date  . Allergy   . BPH (benign prostatic hypertrophy)   . Cancer ([facility])    colon cancer 2010, kidney cancer 2010  . 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 Colon cancer ([facility])   . Elevated PSA   . GERD (gastroesophageal reflux disease)   . Glaucoma 06/09/2012  . Heart murmur    as a child   . History of colon cancer 05/31/2012  . History of colon cancer, stage III ONCOLOGIST--  DR Allayne Gitelman--  NO RECURRENCE   SIGMOID COLON CA  STAGE IIIA ,  T2, N1--  S/P COLONECTOMY AND CHEMOTHERAPY  . History of colon polyps   .  History of gastric polyp   . History of kidney cancer 05/31/2012  . History of prostate cancer 01/27/2014  . History of renal cell carcinoma 05-31-2012   STAGE I  GRADE 2  FUHRMAN--  S/P PARTIAL LEFT NEPHRECOTMY WITH NEGATIVE MARGINS  . Hyperlipidemia   . Hypertension   . Neuromuscular disorder ([facility])    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   . OSA on CPAP    cpap  settings- 10   . Pneumonia    hx of   . Prostate cancer ([facility]) 2015   s/p prostatectomy  . Pulmonary nodule    RIGHT MIDDLE LOBE PER CT 10-26-2013  STABLE  . Sleep apnea    cpap  . Wears glasses   . Wears hearing aid    BILATERAL   Past Surgical History:  Procedure Laterality Date  . CATARACT EXTRACTION W/ INTRAOCULAR LENS  IMPLANT, BILATERAL  04/2011 & 12/2011  . INGUINAL HERNIA REPAIR Left 01/1977  . KNEE ARTHROSCOPY Left 09/2007  . LAPAROSCOPIC SIGMOID COLECTOMY  03/09/2009  ([place])  . LYMPHADENECTOMY Bilateral 01/27/2014   Procedure: BILATERAL PELVIC LYMPH NODE DISSECTION;  Surgeon: Alexis Frock, MD;  Location: [facility];  Service: Urology;  Laterality: Bilateral;  . PROSTATE BIOPSY N/A 11/30/2013   Procedure: SATURATION BIOPSY TRANSRECTAL ULTRASONIC PROSTATE (TUBP);  Surgeon: Alexis Frock, MD;  Location: [facility];  Service: Urology;  Laterality: N/A;  . ROBOT ASSISTED LAPAROSCOPIC RADICAL PROSTATECTOMY N/A 01/27/2014   Procedure: ROBOTIC ASSISTED LAPAROSCOPIC RADICAL PROSTATECTOMY WITH INDOCYANINE GREEN DYE,EXTENSIVE ADHESIOLYSIS " PRE -PERITONEAL";  Surgeon: Alexis Frock, MD;  Location: [facility];  Service: Urology;  Laterality: N/A;  . ROBOTIC ASSITED PARTIAL NEPHRECTOMY Left 03-21-2012     ALLERGIES:  Allergies  Allergen Reactions  . Shellfish Allergy Swelling    Eye puffiness  . Contrast Media [Iodinated Diagnostic Agents] Rash    Erythema on chest w/ scans in [place], pt did fine with 13hr prep  . Ultravist [Iopromide] Rash    Rash on chest     CURRENT MEDICATIONS:  Outpatient Encounter Medications as of 01/12/2018  Medication Sig  . aspirin 81 MG tablet Take 81 mg by mouth daily.  . calcium carbonate (OS-CAL) 600 MG TABS tablet Take 600 mg by mouth daily with breakfast.  . cetirizine (ZYRTEC) 10 MG tablet Take 10 mg by mouth daily as needed for allergies.  Marland Kitchen esomeprazole (NEXIUM) 40 MG capsule Take 40 mg by mouth every evening.   . fenofibrate  (TRICOR) 145 MG tablet Take 145 mg by mouth every evening.   . hydrochlorothiazide (MICROZIDE) 12.5 MG capsule Take 12.5 mg by mouth every morning.   . latanoprost (XALATAN) 0.005 % ophthalmic solution Place 1 drop into both eyes at bedtime.   . metoprolol succinate (TOPROL-XL) 100 MG 24 hr tablet Take 100 mg by mouth every morning. Take with or immediately following a meal.  . Multiple Vitamin (MULTIVITAMIN) tablet Take 1 tablet by mouth daily.  Marland Kitchen oxybutynin (DITROPAN-XL) 10 MG 24 hr tablet TK 1 T PO Q 8 H PRN  . [DISCONTINUED] oxybutynin (DITROPAN) 5 MG tablet Take 1-2 tablets by mouth daily.   No facility-administered encounter medications on file as of 01/12/2018.      ONCOLOGIC FAMILY HISTORY:  Family History  Problem Relation Age of Onset  . Stomach cancer Mother 104  . Hodgkin's lymphoma Father   . Prostate cancer Father   . Colon cancer Neg Hx   . Rectal cancer Neg Hx   .  Esophageal cancer Neg Hx    Genetics: no mutations identified   SOCIAL HISTORY:  Scott Hayden is married and lives with his wife in [place], [place].  Mr. Penrose  has 2 daughters and they live in [place] and the other in [place].  Currently retired.  Denies any current or history of tobacco, alcohol, or illicit drug use. (Reviewed 01/12/2018 and unchanged)   PHYSICAL EXAMINATION:  Vital Signs: Vitals:   01/12/18 1100  BP: 126/76  Pulse: 70  Resp: 18  Temp: 98.6 F (37 C)  SpO2: 98%   Filed Weights   01/12/18 1100  Weight: 253 lb 4.8 oz (114.9 kg)   General: Well-nourished, well-appearing male in no acute distress. Unaccompanied today. HEENT: Head is normocephalic.  Pupils equal and reactive to light. Conjunctivae clear without exudate.  Sclerae anicteric. Oral mucosa is pink, moist.  Oropharynx is pink without lesions or erythema.  Lymph: No cervical, supraclavicular, infraclavicular, or axillary lymphadenopathy noted on palpation.  Cardiovascular: Regular rate and rhythm.Marland Kitchen Respiratory:  Clear to auscultation bilaterally. Chest expansion symmetric; breathing non-labored.  GI: Abdomen soft and round; non-tender, non-distended. Bowel sounds normoactive. No hepatosplenomegaly.   GU: Deferred.  Neuro: No focal deficits. Steady gait.  Psych: Mood and affect normal and appropriate for situation.  Extremities: No edema. Skin: Warm and dry.   LABORATORY DATA:  None for this visit  DIAGNOSTIC IMAGING:  None for this visit     ASSESSMENT AND PLAN:  MrMarland Kitchen Mazur is a pleasant 67 y.o. male with history of Stage IIIA colon cancer, diagnosed in 2010; treated with colectomy and 12 cycles of FOLFOX chemotherapy. ScottGillock presents to the [facility] for surveillance and routine follow-up.   1. History of Stage IIIA colon cancer:  Mr. Allred is currently clinically without evidence of colon cancer. We did not get a CEA on him last week, so I will add this on today.  He will continue to f/u with GI and undergo his colonoscopies as directed.  He will follow-up in the Survivorship Clinic in 1 year with labs, history, and physical exam per surveillance protocol.  I encouraged him to call me with any questions or concerns before his next visit at the cancer center, and I would be happy to see the patient sooner, if needed.    2. Cancer screenings:  Due to Mr. Scheffel history and age, he should receive screening for skin cancers.  He should follow up with GI and urology for his colonoscopy, and prostate checks as indicated. The patient was encouraged to follow-up with his PCP for appropriate cancer screenings.   3. Health maintenance and wellness promotion: Mr. Colgate was encouraged to consume 5-7 servings of fruits and vegetables per day. The patient was also encouraged to engage in moderate to vigorous exercise for 30 minutes per day most days of the week. Mr. Tierno was instructed to limit her alcohol consumption and continue to abstain from tobacco use.    Dispo:  -Return to cancer center to  see Survivorship NP in one year   A total of (20) minutes of face-to-face time was spent with this patient with greater than 50% of that time in counseling and care-coordination.   Gardenia Phlegm, NP Survivorship Program [place] [phone]   Note: PRIMARY CARE PROVIDER Deland Pretty, [place] [phone]

## 2018-01-12 ENCOUNTER — Inpatient Hospital Stay: Payer: Medicare HMO

## 2018-01-12 ENCOUNTER — Telehealth: Payer: Self-pay | Admitting: Adult Health

## 2018-01-12 ENCOUNTER — Encounter: Payer: Self-pay | Admitting: Adult Health

## 2018-01-12 ENCOUNTER — Inpatient Hospital Stay (HOSPITAL_BASED_OUTPATIENT_CLINIC_OR_DEPARTMENT_OTHER): Payer: Medicare HMO | Admitting: Adult Health

## 2018-01-12 VITALS — BP 126/76 | HR 70 | Temp 98.6°F | Resp 18 | Ht 72.0 in | Wt 253.3 lb

## 2018-01-12 DIAGNOSIS — Z85038 Personal history of other malignant neoplasm of large intestine: Secondary | ICD-10-CM

## 2018-01-12 DIAGNOSIS — Z8546 Personal history of malignant neoplasm of prostate: Secondary | ICD-10-CM

## 2018-01-12 DIAGNOSIS — Z85528 Personal history of other malignant neoplasm of kidney: Secondary | ICD-10-CM | POA: Diagnosis not present

## 2018-01-12 DIAGNOSIS — Z79899 Other long term (current) drug therapy: Secondary | ICD-10-CM | POA: Diagnosis not present

## 2018-01-12 LAB — CEA (IN HOUSE-CHCC): CEA (CHCC-IN HOUSE): 3.09 ng/mL (ref 0.00–5.00)

## 2018-01-12 NOTE — Telephone Encounter (Signed)
Per 5/28 patient declined avs and calendar

## 2018-01-13 DIAGNOSIS — G4733 Obstructive sleep apnea (adult) (pediatric): Secondary | ICD-10-CM | POA: Diagnosis not present

## 2018-02-13 DIAGNOSIS — G4733 Obstructive sleep apnea (adult) (pediatric): Secondary | ICD-10-CM | POA: Diagnosis not present

## 2018-03-16 DIAGNOSIS — G4733 Obstructive sleep apnea (adult) (pediatric): Secondary | ICD-10-CM | POA: Diagnosis not present

## 2018-04-16 DIAGNOSIS — G4733 Obstructive sleep apnea (adult) (pediatric): Secondary | ICD-10-CM | POA: Diagnosis not present

## 2018-04-26 DIAGNOSIS — H401111 Primary open-angle glaucoma, right eye, mild stage: Secondary | ICD-10-CM | POA: Diagnosis not present

## 2018-04-26 DIAGNOSIS — H26492 Other secondary cataract, left eye: Secondary | ICD-10-CM | POA: Diagnosis not present

## 2018-05-11 DIAGNOSIS — H26492 Other secondary cataract, left eye: Secondary | ICD-10-CM | POA: Diagnosis not present

## 2018-05-17 DIAGNOSIS — G4733 Obstructive sleep apnea (adult) (pediatric): Secondary | ICD-10-CM | POA: Diagnosis not present

## 2018-05-27 DIAGNOSIS — R69 Illness, unspecified: Secondary | ICD-10-CM | POA: Diagnosis not present

## 2018-06-17 DIAGNOSIS — G4733 Obstructive sleep apnea (adult) (pediatric): Secondary | ICD-10-CM | POA: Diagnosis not present

## 2018-06-23 DIAGNOSIS — H903 Sensorineural hearing loss, bilateral: Secondary | ICD-10-CM | POA: Diagnosis not present

## 2018-07-05 DIAGNOSIS — C61 Malignant neoplasm of prostate: Secondary | ICD-10-CM | POA: Diagnosis not present

## 2018-07-12 DIAGNOSIS — N5201 Erectile dysfunction due to arterial insufficiency: Secondary | ICD-10-CM | POA: Diagnosis not present

## 2018-07-12 DIAGNOSIS — C61 Malignant neoplasm of prostate: Secondary | ICD-10-CM | POA: Diagnosis not present

## 2018-07-12 DIAGNOSIS — N393 Stress incontinence (female) (male): Secondary | ICD-10-CM | POA: Diagnosis not present

## 2018-07-12 DIAGNOSIS — R3915 Urgency of urination: Secondary | ICD-10-CM | POA: Diagnosis not present

## 2018-07-20 DIAGNOSIS — G4733 Obstructive sleep apnea (adult) (pediatric): Secondary | ICD-10-CM | POA: Diagnosis not present

## 2018-08-20 DIAGNOSIS — G4733 Obstructive sleep apnea (adult) (pediatric): Secondary | ICD-10-CM | POA: Diagnosis not present

## 2018-09-21 DIAGNOSIS — G4733 Obstructive sleep apnea (adult) (pediatric): Secondary | ICD-10-CM | POA: Diagnosis not present

## 2018-09-22 DIAGNOSIS — E7800 Pure hypercholesterolemia, unspecified: Secondary | ICD-10-CM | POA: Diagnosis not present

## 2018-09-22 DIAGNOSIS — I10 Essential (primary) hypertension: Secondary | ICD-10-CM | POA: Diagnosis not present

## 2018-09-22 DIAGNOSIS — Z125 Encounter for screening for malignant neoplasm of prostate: Secondary | ICD-10-CM | POA: Diagnosis not present

## 2018-09-22 DIAGNOSIS — Z1159 Encounter for screening for other viral diseases: Secondary | ICD-10-CM | POA: Diagnosis not present

## 2018-09-22 DIAGNOSIS — Z0000 Encounter for general adult medical examination without abnormal findings: Secondary | ICD-10-CM | POA: Diagnosis not present

## 2018-09-29 DIAGNOSIS — L918 Other hypertrophic disorders of the skin: Secondary | ICD-10-CM | POA: Diagnosis not present

## 2018-09-29 DIAGNOSIS — G629 Polyneuropathy, unspecified: Secondary | ICD-10-CM | POA: Diagnosis not present

## 2018-09-29 DIAGNOSIS — K219 Gastro-esophageal reflux disease without esophagitis: Secondary | ICD-10-CM | POA: Diagnosis not present

## 2018-09-29 DIAGNOSIS — G4733 Obstructive sleep apnea (adult) (pediatric): Secondary | ICD-10-CM | POA: Diagnosis not present

## 2018-09-29 DIAGNOSIS — N393 Stress incontinence (female) (male): Secondary | ICD-10-CM | POA: Diagnosis not present

## 2018-09-29 DIAGNOSIS — E782 Mixed hyperlipidemia: Secondary | ICD-10-CM | POA: Diagnosis not present

## 2018-09-29 DIAGNOSIS — I10 Essential (primary) hypertension: Secondary | ICD-10-CM | POA: Diagnosis not present

## 2018-09-29 DIAGNOSIS — R350 Frequency of micturition: Secondary | ICD-10-CM | POA: Diagnosis not present

## 2018-09-29 DIAGNOSIS — Z8546 Personal history of malignant neoplasm of prostate: Secondary | ICD-10-CM | POA: Diagnosis not present

## 2018-10-22 DIAGNOSIS — G4733 Obstructive sleep apnea (adult) (pediatric): Secondary | ICD-10-CM | POA: Diagnosis not present

## 2018-10-23 DIAGNOSIS — K5900 Constipation, unspecified: Secondary | ICD-10-CM | POA: Diagnosis not present

## 2018-10-23 DIAGNOSIS — H409 Unspecified glaucoma: Secondary | ICD-10-CM | POA: Diagnosis not present

## 2018-10-23 DIAGNOSIS — E785 Hyperlipidemia, unspecified: Secondary | ICD-10-CM | POA: Diagnosis not present

## 2018-10-23 DIAGNOSIS — G620 Drug-induced polyneuropathy: Secondary | ICD-10-CM | POA: Diagnosis not present

## 2018-10-23 DIAGNOSIS — I10 Essential (primary) hypertension: Secondary | ICD-10-CM | POA: Diagnosis not present

## 2018-10-23 DIAGNOSIS — J302 Other seasonal allergic rhinitis: Secondary | ICD-10-CM | POA: Diagnosis not present

## 2018-10-23 DIAGNOSIS — G4733 Obstructive sleep apnea (adult) (pediatric): Secondary | ICD-10-CM | POA: Diagnosis not present

## 2018-10-23 DIAGNOSIS — K219 Gastro-esophageal reflux disease without esophagitis: Secondary | ICD-10-CM | POA: Diagnosis not present

## 2018-10-23 DIAGNOSIS — C61 Malignant neoplasm of prostate: Secondary | ICD-10-CM | POA: Diagnosis not present

## 2018-11-22 DIAGNOSIS — G4733 Obstructive sleep apnea (adult) (pediatric): Secondary | ICD-10-CM | POA: Diagnosis not present

## 2018-12-17 ENCOUNTER — Encounter: Payer: Self-pay | Admitting: Adult Health

## 2018-12-20 DIAGNOSIS — Z961 Presence of intraocular lens: Secondary | ICD-10-CM | POA: Diagnosis not present

## 2018-12-20 DIAGNOSIS — H401131 Primary open-angle glaucoma, bilateral, mild stage: Secondary | ICD-10-CM | POA: Diagnosis not present

## 2018-12-20 NOTE — Telephone Encounter (Signed)
Please advise scheduling of Duron Meister F/U appointment.

## 2018-12-23 DIAGNOSIS — G4733 Obstructive sleep apnea (adult) (pediatric): Secondary | ICD-10-CM | POA: Diagnosis not present

## 2018-12-24 ENCOUNTER — Telehealth: Payer: Self-pay | Admitting: Adult Health

## 2018-12-24 NOTE — Telephone Encounter (Signed)
Moved lab to June and scheduled webex per sch msg. Mailed printout

## 2019-01-06 ENCOUNTER — Other Ambulatory Visit: Payer: Medicare HMO

## 2019-01-13 ENCOUNTER — Ambulatory Visit: Payer: Medicare HMO | Admitting: Adult Health

## 2019-01-15 ENCOUNTER — Encounter: Payer: Self-pay | Admitting: Adult Health

## 2019-02-01 ENCOUNTER — Other Ambulatory Visit: Payer: Self-pay | Admitting: Adult Health

## 2019-02-01 DIAGNOSIS — Z85038 Personal history of other malignant neoplasm of large intestine: Secondary | ICD-10-CM

## 2019-02-01 DIAGNOSIS — Z8546 Personal history of malignant neoplasm of prostate: Secondary | ICD-10-CM

## 2019-02-01 DIAGNOSIS — Z85528 Personal history of other malignant neoplasm of kidney: Secondary | ICD-10-CM

## 2019-02-02 ENCOUNTER — Inpatient Hospital Stay: Payer: Medicare HMO | Attending: Hematology and Oncology

## 2019-02-02 ENCOUNTER — Other Ambulatory Visit: Payer: Self-pay

## 2019-02-02 DIAGNOSIS — Z85038 Personal history of other malignant neoplasm of large intestine: Secondary | ICD-10-CM | POA: Diagnosis not present

## 2019-02-02 DIAGNOSIS — Z8546 Personal history of malignant neoplasm of prostate: Secondary | ICD-10-CM | POA: Diagnosis not present

## 2019-02-02 DIAGNOSIS — Z85528 Personal history of other malignant neoplasm of kidney: Secondary | ICD-10-CM | POA: Insufficient documentation

## 2019-02-02 LAB — CBC WITH DIFFERENTIAL (CANCER CENTER ONLY)
Abs Immature Granulocytes: 0.02 10*3/uL (ref 0.00–0.07)
Basophils Absolute: 0 10*3/uL (ref 0.0–0.1)
Basophils Relative: 1 %
Eosinophils Absolute: 0.3 10*3/uL (ref 0.0–0.5)
Eosinophils Relative: 5 %
HCT: 44.3 % (ref 39.0–52.0)
Hemoglobin: 14.8 g/dL (ref 13.0–17.0)
Immature Granulocytes: 0 %
Lymphocytes Relative: 27 %
Lymphs Abs: 1.6 10*3/uL (ref 0.7–4.0)
MCH: 30.3 pg (ref 26.0–34.0)
MCHC: 33.4 g/dL (ref 30.0–36.0)
MCV: 90.8 fL (ref 80.0–100.0)
Monocytes Absolute: 0.6 10*3/uL (ref 0.1–1.0)
Monocytes Relative: 10 %
Neutro Abs: 3.2 10*3/uL (ref 1.7–7.7)
Neutrophils Relative %: 57 %
Platelet Count: 238 10*3/uL (ref 150–400)
RBC: 4.88 MIL/uL (ref 4.22–5.81)
RDW: 13.1 % (ref 11.5–15.5)
WBC Count: 5.8 10*3/uL (ref 4.0–10.5)
nRBC: 0 % (ref 0.0–0.2)

## 2019-02-02 LAB — CMP (CANCER CENTER ONLY)
ALT: 21 U/L (ref 0–44)
AST: 24 U/L (ref 15–41)
Albumin: 4.3 g/dL (ref 3.5–5.0)
Alkaline Phosphatase: 34 U/L — ABNORMAL LOW (ref 38–126)
Anion gap: 10 (ref 5–15)
BUN: 13 mg/dL (ref 8–23)
CO2: 27 mmol/L (ref 22–32)
Calcium: 9.7 mg/dL (ref 8.9–10.3)
Chloride: 103 mmol/L (ref 98–111)
Creatinine: 1.13 mg/dL (ref 0.61–1.24)
GFR, Est AFR Am: >60 mL/min (ref >60)
GFR, Estimated: >60 mL/min (ref >60)
Glucose, Bld: 115 mg/dL — ABNORMAL HIGH (ref 70–99)
Potassium: 3.9 mmol/L (ref 3.5–5.1)
Sodium: 140 mmol/L (ref 135–145)
Total Bilirubin: 0.6 mg/dL (ref 0.3–1.2)
Total Protein: 7.5 g/dL (ref 6.5–8.1)

## 2019-02-02 LAB — LACTATE DEHYDROGENASE: LDH: 159 U/L (ref 98–192)

## 2019-02-02 LAB — CEA (IN HOUSE-CHCC): CEA (CHCC-In House): 3.36 ng/mL (ref 0.00–5.00)

## 2019-02-03 DIAGNOSIS — G4733 Obstructive sleep apnea (adult) (pediatric): Secondary | ICD-10-CM | POA: Diagnosis not present

## 2019-02-08 ENCOUNTER — Telehealth: Payer: Self-pay | Admitting: Adult Health

## 2019-02-08 NOTE — Telephone Encounter (Signed)
Called patient regarding upcoming Webex appointment, patient is notified and e-mail has been sent. °

## 2019-02-09 ENCOUNTER — Inpatient Hospital Stay (HOSPITAL_BASED_OUTPATIENT_CLINIC_OR_DEPARTMENT_OTHER): Payer: Medicare HMO | Admitting: Adult Health

## 2019-02-09 ENCOUNTER — Encounter: Payer: Self-pay | Admitting: Adult Health

## 2019-02-09 DIAGNOSIS — Z85038 Personal history of other malignant neoplasm of large intestine: Secondary | ICD-10-CM | POA: Diagnosis not present

## 2019-02-09 NOTE — Progress Notes (Signed)
CLINIC:  Survivorship   I connected with Scott Hayden on 02/09/19 at  9:00 AM EDT by [facility] and verified that I am speaking with the correct person using two identifiers.  I discussed the limitations, risks, security and privacy concerns of performing an evaluation and management service by [facility] and the availability of in person appointments.  I also discussed with the patient that there may be a patient responsible charge related to this service. The patient expressed understanding and agreed to proceed.    REASON FOR VISIT:  Routine follow-up for history of colon cancer cancer.   BRIEF ONCOLOGIC HISTORY:  This is a pleasant, retired Hayden who moved here from [place] . 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 He received 12 cycles of FOLFOX chemotherapy over 6 months. He developed a severe distal neuropathy, upper and lower extremities.  During his initial staging evaluation, CT scan revealed a 2 cm lesion in his left kidney. When he recovered from chemotherapy for the colon cancer he underwent a right robotic partial left nephrectomy 03/21/2010. Pathology showed a 2 cm clear cell grade 2 lesion with no vascular invasion. He has a history of BPH and an elevated PSA. Biopsies in the past were negative for cancer. In June of 2015, he underwent robotic prostatectomy and was noted to have bilateral disease in the prostate. Repeat colonoscopy in November 2015 only show 1 Polyp and biopsy was benign  INTERVAL HISTORY:  Scott Hayden presents to the [facility] today for routine follow-up for his history of colon cancer.  This appointment is virtual, and he is on [facility] from [place].  He is visiting with his kids/grandkids.  Overall, he reports feeling quite well. His last colonoscopy was on 07/05/2014.  He is to  have these done every 5 years, next due in 06/2019.  He also has prostate cancer and has undergone prostatectomy.  He also has history of kidney cancer.  He follows with Dr. Tresa Moore for both of these issues and sees him 1-2 times per year.  His last PSA remained undetectable.  Since his last visit, Scott Hayden has continued to do well.  He notes that initially, when he was diagnosed with colon cancer, he had loose stools.  Now, he has experienced increased constipation.  He notes that since his visit with his grandchildren, he has been more active and his constipation has improved.  He denies any new pain with this or discomfort.  His labs remain normal.    REVIEW OF SYSTEMS:  Review of Systems  Constitutional: Negative for appetite change, chills, fatigue, fever and unexpected weight change.  HENT:   Negative for hearing loss, lump/mass, sore throat and trouble swallowing.   Eyes: Negative for eye problems and icterus.  Respiratory: Negative for chest tightness, cough and shortness of breath.   Cardiovascular: Negative for chest pain, leg swelling and palpitations.  Gastrointestinal: Positive for constipation. Negative for abdominal distention, abdominal pain, blood in stool, diarrhea, nausea, rectal pain and vomiting.  Endocrine: Negative for hot flashes.  Genitourinary: Negative for difficulty urinating.   Musculoskeletal: Negative for arthralgias.  Skin: Negative for itching and rash.  Neurological: Negative for dizziness, extremity weakness, headaches and numbness.  Hematological: Negative for adenopathy. Does not bruise/bleed easily.  Psychiatric/Behavioral: Negative for depression. The patient is not nervous/anxious.  PAST MEDICAL/SURGICAL HISTORY:  Past Medical History:  Diagnosis Date  . Allergy   . BPH (benign prostatic hypertrophy)   . Cancer ([place])    colon cancer 2010, kidney cancer 2010  . Chemotherapy-induced neuropathy (HCC)    OXALIPLATINUM CHEMO DRUG--  BILATERAL DISTAL  UPPER AND LOWER EXTREMITIES  . Colon cancer ([place])   . Elevated PSA   . GERD (gastroesophageal reflux disease)   . Glaucoma 06/09/2012  . Heart murmur    as a child   . History of colon cancer 05/31/2012  . History of colon cancer, stage III ONCOLOGIST--  DR Allayne Gitelman--  NO RECURRENCE   SIGMOID COLON CA  STAGE IIIA ,  T2, N1--  S/P COLONECTOMY AND CHEMOTHERAPY  . History of colon polyps   . History of gastric polyp   . History of kidney cancer 05/31/2012  . History of prostate cancer 01/27/2014  . History of renal cell carcinoma 05-31-2012   STAGE I  GRADE 2  FUHRMAN--  S/P PARTIAL LEFT NEPHRECOTMY WITH NEGATIVE MARGINS  . Hyperlipidemia   . Hypertension   . Neuromuscular disorder ([place])    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   . OSA on CPAP    cpap settings- 10   . Pneumonia    hx of   . Prostate cancer ([place]) 2015   s/p prostatectomy  . Pulmonary nodule    RIGHT MIDDLE LOBE PER CT 10-26-2013  STABLE  . Sleep apnea    cpap  . Wears glasses   . Wears hearing aid    BILATERAL   Past Surgical History:  Procedure Laterality Date  . CATARACT EXTRACTION W/ INTRAOCULAR LENS  IMPLANT, BILATERAL  04/2011 & 12/2011  . INGUINAL HERNIA REPAIR Left 01/1977  . KNEE ARTHROSCOPY Left 09/2007  . LAPAROSCOPIC SIGMOID COLECTOMY  03/09/2009  ([place])  . LYMPHADENECTOMY Bilateral 01/27/2014   Procedure: BILATERAL PELVIC LYMPH NODE DISSECTION;  Surgeon: Alexis Frock, MD;  Location: [facility];  Service: Urology;  Laterality: Bilateral;  . PROSTATE BIOPSY N/A 11/30/2013   Procedure: SATURATION BIOPSY TRANSRECTAL ULTRASONIC PROSTATE (TUBP);  Surgeon: Alexis Frock, MD;  Location: [facility];  Service: Urology;  Laterality: N/A;  . ROBOT ASSISTED LAPAROSCOPIC RADICAL PROSTATECTOMY N/A 01/27/2014   Procedure: ROBOTIC ASSISTED LAPAROSCOPIC RADICAL PROSTATECTOMY WITH INDOCYANINE GREEN DYE,EXTENSIVE ADHESIOLYSIS " PRE -PERITONEAL";  Surgeon: Alexis Frock, MD;  Location: [facility];   Service: Urology;  Laterality: N/A;  . ROBOTIC ASSITED PARTIAL NEPHRECTOMY Left 03-21-2012     ALLERGIES:  Allergies  Allergen Reactions  . Shellfish Allergy Swelling    Eye puffiness  . Contrast Media [Iodinated Diagnostic Agents] Rash    Erythema on chest w/ scans in [place], pt did fine with 13hr prep  . Ultravist [Iopromide] Rash    Rash on chest     CURRENT MEDICATIONS:  Outpatient Encounter Medications as of 02/09/2019  Medication Sig  . aspirin 81 MG tablet Take 81 mg by mouth daily.  . calcium carbonate (OS-CAL) 600 MG TABS tablet Take 600 mg by mouth daily with breakfast.  . cetirizine (ZYRTEC) 10 MG tablet Take 10 mg by mouth daily as needed for allergies.  Marland Kitchen esomeprazole (NEXIUM) 40 MG capsule Take 40 mg by mouth every evening.   . fenofibrate (TRICOR) 145 MG tablet Take 145 mg by mouth every evening.   . hydrochlorothiazide (MICROZIDE) 12.5 MG capsule Take 12.5 mg by mouth every morning.   . latanoprost (XALATAN) 0.005 % ophthalmic solution Place 1 drop into both  eyes at bedtime.   . metoprolol succinate (TOPROL-XL) 100 MG 24 hr tablet Take 100 mg by mouth every morning. Take with or immediately following a meal.  . Multiple Vitamin (MULTIVITAMIN) tablet Take 1 tablet by mouth daily.  Marland Kitchen oxybutynin (DITROPAN-XL) 10 MG 24 hr tablet TK 1 T PO Q 8 H PRN   No facility-administered encounter medications on file as of 02/09/2019.      ONCOLOGIC FAMILY HISTORY:  Family History  Problem Relation Age of Onset  . Stomach cancer Mother 52  . Hodgkin's lymphoma Father   . Prostate cancer Father   . Colon cancer Neg Hx   . Rectal cancer Neg Hx   . Esophageal cancer Neg Hx    Genetics: no mutations identified   SOCIAL HISTORY:  Scott Hayden is married and lives with his wife in [place], [place].  Scott Hayden  has 2 daughters and they live in [place] and the other in [place].  Currently retired.  Denies any current or history of tobacco, alcohol, or illicit drug use.  (Reviewed 01/2019 and unchanged)   OBJECTIVE:  Patient appears well.  He is in no apparent distress.  Skin is without rash or lesion.  Breathing is non labored.  Neuro is focally intact.  Mood and behavior are normal.    LABORATORY DATA:   Appointment on 02/02/2019  Component Date Value Ref Range Status  . LDH 02/02/2019 159  98 - 192 U/L Final   Performed at [facility], [place] [address], [place], [place] [address]  . CEA ([facility]In [facility]) 02/02/2019 3.36  0.00 - 5.00 ng/mL Final   Comment: (NOTE) This test was performed using Architect's Chemiluminescent Microparticle Immunoassay. Values obtained from different assay methods cannot be used interchangeably. Please note that 5-10% of patients who smoke may see CEA levels up to 6.9 ng/mL. Performed at [facility], [place] [address], [place], [place] [address]   . Sodium 02/02/2019 140  135 - 145 mmol/L Final  . Potassium 02/02/2019 3.9  3.5 - 5.1 mmol/L Final  . Chloride 02/02/2019 103  98 - 111 mmol/L Final  . CO2 02/02/2019 27  22 - 32 mmol/L Final  . Glucose, Bld 02/02/2019 115* 70 - 99 mg/dL Final  . BUN 02/02/2019 13  8 - 23 mg/dL Final  . Creatinine 02/02/2019 1.13  0.61 - 1.24 mg/dL Final  . Calcium 02/02/2019 9.7  8.9 - 10.3 mg/dL Final  . Total Protein 02/02/2019 7.5  6.5 - 8.1 g/dL Final  . Albumin 02/02/2019 4.3  3.5 - 5.0 g/dL Final  . AST 02/02/2019 24  15 - 41 U/L Final  . ALT 02/02/2019 21  0 - 44 U/L Final  . Alkaline Phosphatase 02/02/2019 34* 38 - 126 U/L Final  . Total Bilirubin 02/02/2019 0.6  0.3 - 1.2 mg/dL Final  . GFR, Est Non Af Am 02/02/2019 >60  >60 mL/min Final  . GFR, Est AFR Am 02/02/2019 >60  >60 mL/min Final  . Anion gap 02/02/2019 10  5 - 15 Final   Performed at [facility], [place] [address], [place], [place] [address]  . WBC Count 02/02/2019 5.8  4.0 - 10.5 K/uL Final  . RBC 02/02/2019 4.88  4.22 - 5.81 MIL/uL Final  .  Hemoglobin 02/02/2019 14.8  13.0 - 17.0 g/dL Final  . HCT 02/02/2019 44.3  39.0 - 52.0 % Final  . MCV 02/02/2019 90.8  80.0 - 100.0 fL Final  .  MCH 02/02/2019 30.3  26.0 - 34.0 pg Final  . MCHC 02/02/2019 33.4  30.0 - 36.0 g/dL Final  . RDW 02/02/2019 13.1  11.5 - 15.5 % Final  . Platelet Count 02/02/2019 238  150 - 400 K/uL Final  . nRBC 02/02/2019 0.0  0.0 - 0.2 % Final  . Neutrophils Relative % 02/02/2019 57  % Final  . Neutro Abs 02/02/2019 3.2  1.7 - 7.7 K/uL Final  . Lymphocytes Relative 02/02/2019 27  % Final  . Lymphs Abs 02/02/2019 1.6  0.7 - 4.0 K/uL Final  . Monocytes Relative 02/02/2019 10  % Final  . Monocytes Absolute 02/02/2019 0.6  0.1 - 1.0 K/uL Final  . Eosinophils Relative 02/02/2019 5  % Final  . Eosinophils Absolute 02/02/2019 0.3  0.0 - 0.5 K/uL Final  . Basophils Relative 02/02/2019 1  % Final  . Basophils Absolute 02/02/2019 0.0  0.0 - 0.1 K/uL Final  . Immature Granulocytes 02/02/2019 0  % Final  . Abs Immature Granulocytes 02/02/2019 0.02  0.00 - 0.07 K/uL Final   Performed at [facility], [place] [address] [place], [place] [address]    DIAGNOSTIC IMAGING:  None for this visit     ASSESSMENT AND PLAN:  Mr.. Abdon is a pleasant 68 y.o. male with history of Stage IIIA colon cancer, diagnosed in 2010; treated with colectomy and 12 cycles of FOLFOX chemotherapy. ScottHayden presents to the Survivorship Clinic for surveillance and routine follow-up.   1. History of Stage IIIA colon cancer:  Scott Hayden is currently clinically without evidence of cancer recurrence.  He will return in one year for LTS follow up with labs prior.  We reviewed his labs noted above which remain normal.   I encouraged him to call me with any questions or concerns before his next visit at the cancer center, and I would be happy to see the patient sooner, if needed.    2. Constipation: Reviewed increasing water and fiber intake, and maintaining his activity level.  He  agrees.  I will reach out to his gastroenterologist Dr. Ardis Hayden about this, as patient is slightly anxious about going 5 years in between his last colonoscopy and upcoming one in November.    3. Cancer screenings:  Due to Scott Hayden history and age, he should receive screening for skin cancers.  He should follow up with GI for his colonoscopy; and urology for his h/o prostate cancer and kidney cancer as recommended by Dr. Ardis Hayden and Dr. Tresa Moore. The patient was encouraged to follow-up with his PCP regularly.   3. Health maintenance and wellness promotion: Mr. Degen was encouraged to consume 5-7 servings of fruits and vegetables per day. The patient was also encouraged to engage in moderate to vigorous exercise for 30 minutes per day most days of the week. Mr. Caraveo was instructed to limit his alcohol consumption and continue to abstain from tobacco use.   Follow up instructions:  -Return to cancer center to see Survivorship NP in one year with labs prior   The patient was provided an opportunity to ask questions and all were answered. The patient agreed with the plan and demonstrated an understanding of the instructions.   The patient was advised to call back or seek an in-person evaluation if the symptoms worsen or if the condition fails to improve as anticipated.   I provided 20 minutes of face-to-face video visit time during this encounter, and > 50% was spent counseling as documented under my  assessment & plan.   Gardenia Phlegm, NP Survivorship Program [place] [phone]   Note: PRIMARY CARE PROVIDER [place] Pretty, [place] [phone]

## 2019-02-10 ENCOUNTER — Telehealth: Payer: Self-pay | Admitting: Adult Health

## 2019-02-10 NOTE — Telephone Encounter (Signed)
I talk with patient he said that he would look at his my chart for schedule

## 2019-02-11 NOTE — Progress Notes (Signed)
Spoke to patient. Appointment has been scheduled for a phone visit on 03/18/19 at 10;30 am

## 2019-02-11 NOTE — Progress Notes (Signed)
I spoke to patient. Appointment made for next available on 03/18/19 1030am phone visit.

## 2019-03-07 DIAGNOSIS — G4733 Obstructive sleep apnea (adult) (pediatric): Secondary | ICD-10-CM | POA: Diagnosis not present

## 2019-03-18 ENCOUNTER — Ambulatory Visit (INDEPENDENT_AMBULATORY_CARE_PROVIDER_SITE_OTHER): Payer: Medicare HMO | Admitting: Gastroenterology

## 2019-03-18 ENCOUNTER — Encounter: Payer: Self-pay | Admitting: Gastroenterology

## 2019-03-18 VITALS — Ht 72.0 in | Wt 240.0 lb

## 2019-03-18 DIAGNOSIS — K5900 Constipation, unspecified: Secondary | ICD-10-CM | POA: Diagnosis not present

## 2019-03-18 MED ORDER — PEG 3350-KCL-NA BICARB-NACL 420 G PO SOLR: 4000.0000 mL | ORAL | 0 refills | Status: DC

## 2019-03-18 NOTE — Progress Notes (Signed)
Review of pertinent gastrointestinal problems: 1. Sigmoid colon cancer Colonoscopy July 2010 done for "polyp history" by Dr. Nobie Putnam in [place], [place]: Findings sigmoid diverticulosis, internal hemorrhoids, ulcerated polypoid lesion at 20 cm from the anal verge. Biopsies of the 3 cm lesion in sigmoid colon showed adenocarcinoma. Colonoscopy July 2011,same physician, done for history of colon cancer, findings "all the visualized mucosal surfaces appeared normal except sigmoid diverticulosis, internal hemorrhoids, the anastomosis was normal." Colonoscopy July 2012by the same physician, done for history of colon cancer, findings "all the visualized mucosal surfaces appeared normal except the following. Sigmoid diverticulosis, the anastomosis was normal at 20 cm, no tumor was seen, positive for internal hemorrhoids.  Colonoscopy Dr. Ardis Hughs 06/2014: normal anastomosis at 20cm.  Left sided diverticulosis, 60mm polyp that was a typical adenoma on path 2.  Change in bowel habits 2018 while on vacation.  Gradually improved with time.  I recommended adding fiber supplements.   EGD February 2013 done for GERD, same physician, showed small hiatal hernia, short segment of Barrett's-like changes, multiple polyps in the stomach. Biopsies were negative for H. pylori, they showed no intestinal metaplasia, or polyps were fundic gland polyps.   This service was provided via virtual visit.  This was done by audio only the patient was located at home.  I was located in my office.  The patient did consent to this virtual visit and is aware of possible charges through their insurance for this visit.  The patient is an established patient.  My certified medical assistant, Grace Bushy, contributed to this visit by contacting the patient by phone 1 or 2 business days prior to the appointment and also followed up on the recommendations I made after the visit.  Time spent on virtual visit: 23 minutes   HPI: This is a very pleasant  68 year old man whom I last saw about 2 years ago.  At the time of his last visit he had had some changes in his bowel habits I recommended over-the-counter fiber supplement which he did take until he completely completed the bottle.  He did notice that his bowels had improved while taking it.   New recent constipation.  He goes every 3 days, with a lot of straining and pushing. He's tried flax seed QOD.  Flax seen hasen't helped much.  He has had no overt GI bleeding.  Prior to this change in his bowels he would generally have a bowel movement every other day without pushing or straining.  He's been excercising a lot, lost 8-10 pounds.  He rides his bike about 8 miles a day every other day he is also working in his garage on a motorcycle and admits he has been sweating quite a bit.  He is not sure if he is keeping up with his hydration well  Chief complaint is change in bowels, constipation  ROS: complete GI ROS as described in HPI, all other review negative.  Constitutional:  No unintentional weight loss   Past Medical History:  Diagnosis Date  . Allergy   . BPH (benign prostatic hypertrophy)   . Cancer ([place])    colon cancer 2010, kidney cancer 2010  . 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 Colon cancer ([place])   . Elevated PSA   . GERD (gastroesophageal reflux disease)   . Glaucoma 06/09/2012  . Heart murmur    as a child   . History of colon cancer 05/31/2012  . History  of colon cancer, stage III ONCOLOGIST--  DR Allayne Gitelman--  NO RECURRENCE   SIGMOID COLON CA  STAGE IIIA ,  T2, N1--  S/P COLONECTOMY AND CHEMOTHERAPY  . History of colon polyps   . History of gastric polyp   . History of kidney cancer 05/31/2012  . History of prostate cancer 01/27/2014  . History of renal cell carcinoma 05-31-2012   STAGE I  GRADE 2  FUHRMAN--  S/P PARTIAL LEFT NEPHRECOTMY WITH NEGATIVE MARGINS  . Hyperlipidemia   .  Hypertension   . 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 OSA on CPAP    cpap settings- 10   . Pneumonia    hx of   . Prostate cancer ([place]) 2015   s/p prostatectomy  . Pulmonary nodule    RIGHT MIDDLE LOBE PER CT 10-26-2013  STABLE  . Sleep apnea    cpap  . Wears glasses   . Wears hearing aid    BILATERAL    Past Surgical History:  Procedure Laterality Date  . CATARACT EXTRACTION W/ INTRAOCULAR LENS  IMPLANT, BILATERAL  04/2011 & 12/2011  . INGUINAL HERNIA REPAIR Left 01/1977  . KNEE ARTHROSCOPY Left 09/2007  . LAPAROSCOPIC SIGMOID COLECTOMY  03/09/2009  ([place])  . LYMPHADENECTOMY Bilateral 01/27/2014   Procedure: BILATERAL PELVIC LYMPH NODE DISSECTION;  Surgeon: Alexis Frock, MD;  Location: [facility];  Service: Urology;  Laterality: Bilateral;  . PROSTATE BIOPSY N/A 11/30/2013   Procedure: SATURATION BIOPSY TRANSRECTAL ULTRASONIC PROSTATE (TUBP);  Surgeon: Alexis Frock, MD;  Location: [facility];  Service: Urology;  Laterality: N/A;  . ROBOT ASSISTED LAPAROSCOPIC RADICAL PROSTATECTOMY N/A 01/27/2014   Procedure: ROBOTIC ASSISTED LAPAROSCOPIC RADICAL PROSTATECTOMY WITH INDOCYANINE GREEN DYE,EXTENSIVE ADHESIOLYSIS " PRE -PERITONEAL";  Surgeon: Alexis Frock, MD;  [facility];  Service: Urology;  Laterality: N/A;  . ROBOTIC ASSITED PARTIAL NEPHRECTOMY Left 03-21-2012    Current Outpatient Medications  Medication Sig Dispense Refill  . aspirin 81 MG tablet Take 81 mg by mouth daily.    . calcium carbonate (OS-CAL) 600 MG TABS tablet Take 600 mg by mouth daily with breakfast.    . cetirizine (ZYRTEC) 10 MG tablet Take 10 mg by mouth daily as needed for allergies.    Marland Kitchen esomeprazole (NEXIUM) 40 MG capsule Take 40 mg by mouth every evening.     . fenofibrate (TRICOR) 145 MG tablet Take 145 mg by mouth every evening.     . latanoprost (XALATAN) 0.005 % ophthalmic solution Place 1 drop into both eyes at bedtime.     . metoprolol  succinate (TOPROL-XL) 100 MG 24 hr tablet Take 100 mg by mouth every morning. Take with or immediately following a meal.    . Multiple Vitamin (MULTIVITAMIN) tablet Take 1 tablet by mouth daily.    Marland Kitchen oxybutynin (DITROPAN-XL) 10 MG 24 hr tablet TK 1 T PO Q 8 H PRN  3  . hydrochlorothiazide (MICROZIDE) 12.5 MG capsule Take 12.5 mg by mouth every morning.      No current facility-administered medications for this visit.     Allergies as of 03/18/2019 - Review Complete 03/18/2019  Allergen Reaction Noted  . Shellfish allergy Swelling 06/09/2012  . Contrast media [iodinated diagnostic agents] Rash 06/16/2012  . Ultravist [iopromide] Rash 06/09/2012    Family History  Problem Relation Age of Onset  . Stomach cancer Mother 43  . Hodgkin's lymphoma Father   . Prostate cancer Father   .  Colon cancer Neg Hx   . Rectal cancer Neg Hx   . Esophageal cancer Neg Hx     Social History   Socioeconomic History  . Marital status: Married    Spouse name: Thayer Headings  . Number of children: 2  . Years of education: MS+  . Highest education level: Not on file  Occupational History  . Occupation: Retired  Scientific laboratory technician  . Financial resource strain: Not on file  . Food insecurity    Worry: Not on file    Inability: Not on file  . Transportation needs    Medical: Not on file    Non-medical: Not on file  Tobacco Use  . Smoking status: Never Smoker  . Smokeless tobacco: Never Used  Substance and Sexual Activity  . Alcohol use: Yes    Alcohol/week: 0.0 standard drinks    Comment: RARE  . Drug use: No  . Sexual activity: Not on file  Lifestyle  . Physical activity    Days per week: Not on file    Minutes per session: Not on file  . Stress: Not on file  Relationships  . Social Herbalist on phone: Not on file    Gets together: Not on file    Attends religious service: Not on file    Active member of club or organization: Not on file    Attends meetings of clubs or organizations:  Not on file    Relationship status: Not on file  . Intimate partner violence    Fear of current or ex partner: Not on file    Emotionally abused: Not on file    Physically abused: Not on file    Forced sexual activity: Not on file  Other Topics Concern  . Not on file  Social History Narrative   Pt lives at home with spouse.    Caffeine Use: 2-3 cups daily.      Physical Exam: Unable to perform because this was a "telemed visit" due to current Covid-19 pandemic  Assessment and plan: 68 y.o. male with change in bowels, constipation, personal history of colon cancer  I think it is pretty unlikely that this change in his bowels is related to a recurrent neoplasm.  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  I recommended he try to really focus on keeping hydrated and I also recommended he try resuming the previous fiber supplement that seemed to help him 2 years ago.  He is "due" for a surveillance colonoscopy in about 2 months and given this change in his bowel habits it is certainly safest that we proceed with colonoscopy at this point now given his personal history of colon cancer.  Please see the "Patient Instructions" section for addition details about the plan.  Owens Loffler, MD [facility] 03/18/2019, 10:12 AM

## 2019-03-18 NOTE — Patient Instructions (Addendum)
He knows to start focusing on staying as hydrated as he can throughout the day.  He will resume over-the-counter daily powder fiber supplements.  Given his personal history of colon cancer and his change in bowel habits we will arrange for a colonoscopy to be performed at his soonest convenience.  Thank you for entrusting me with your care and choosing [facility].  Dr Ardis Hughs

## 2019-04-08 DIAGNOSIS — G4733 Obstructive sleep apnea (adult) (pediatric): Secondary | ICD-10-CM | POA: Diagnosis not present

## 2019-04-14 ENCOUNTER — Encounter: Payer: Self-pay | Admitting: Gastroenterology

## 2019-04-15 ENCOUNTER — Encounter: Payer: Self-pay | Admitting: Gastroenterology

## 2019-04-15 ENCOUNTER — Telehealth: Payer: Self-pay | Admitting: Gastroenterology

## 2019-04-15 NOTE — Telephone Encounter (Signed)
error 

## 2019-04-15 NOTE — Telephone Encounter (Signed)
Covid-19 screening questions °  °  °Do you now or have you had a fever in the last 14 days?  NO °  °Do you have any respiratory symptoms of shortness of breath or cough now or in the last 14 days?  NO °  °Do you have any family members or close contacts with diagnosed or suspected Covid-19 in the past 14 days? NO °  °Have you been tested for Covid-19 and found to be positive? NO ° °Advised to check in on the 4th floor and that care partner may wait in the car or come up to the lobby during procedure. Also made aware that both must enter the building wearing a mask. °

## 2019-04-18 ENCOUNTER — Encounter: Payer: Self-pay | Admitting: Gastroenterology

## 2019-04-18 ENCOUNTER — Other Ambulatory Visit: Payer: Self-pay

## 2019-04-18 ENCOUNTER — Ambulatory Visit (AMBULATORY_SURGERY_CENTER): Payer: Medicare HMO | Admitting: Gastroenterology

## 2019-04-18 VITALS — BP 126/86 | HR 55 | Temp 96.6°F | Resp 13 | Ht 72.0 in | Wt 240.0 lb

## 2019-04-18 DIAGNOSIS — K5900 Constipation, unspecified: Secondary | ICD-10-CM

## 2019-04-18 DIAGNOSIS — Z85038 Personal history of other malignant neoplasm of large intestine: Secondary | ICD-10-CM | POA: Diagnosis not present

## 2019-04-18 DIAGNOSIS — D120 Benign neoplasm of cecum: Secondary | ICD-10-CM

## 2019-04-18 MED ORDER — SODIUM CHLORIDE 0.9 % IV SOLN: 500.0000 mL | Freq: Once | INTRAVENOUS | Status: DC

## 2019-04-18 NOTE — Progress Notes (Signed)
Called to room to assist during endoscopic procedure.  Patient ID and intended procedure confirmed with present staff. Received instructions for my participation in the procedure from the performing physician.  

## 2019-04-18 NOTE — Progress Notes (Signed)
PT taken to PACU. Monitors in place. VSS. Report given to RN. 

## 2019-04-18 NOTE — Progress Notes (Signed)
Temp taken by JB VS taken by [place] 

## 2019-04-18 NOTE — Op Note (Signed)
[facility] Patient Name: Scott Hayden Procedure Date: 04/18/2019 8:00 AM MRN: [id number] Endoscopist: Milus Banister , MD Age: 68 Referring MD:  Date of Birth: Aug 29, 1950 Gender: Male Account #: [id number] Procedure:                Colonoscopy Indications:              High risk colon cancer surveillance: Personal                            history of colon cancer; Sigmoid colon                            cancerColonoscopy July 2010 done for "polyp                            history" by Dr. Nobie Putnam in [place], [place]: Findings                            sigmoid diverticulosis, internal hemorrhoids,                            ulcerated polypoid lesion at 20 cm from the anal                            verge. Biopsies of the 3 cm lesion in sigmoid colon                            showed adenocarcinoma. Colonoscopy July 2011,same                            physician, done for history of colon cancer,                            findings "all the visualized mucosal surfaces                            appeared normal except sigmoid diverticulosis,                            internal hemorrhoids, the anastomosis was normal."                            Colonoscopy July 2012by the same physician, done                            for history of colon cancer, findings "all the                            visualized mucosal surfaces appeared normal except                            the following. Sigmoid diverticulosis, the  anastomosis was normal at 20 cm, no tumor was seen,                            positive for internal hemorrhoids.Colonoscopy Dr.                            Ardis Hughs 06/2014: normal anastomosis at 20cm. Left                            sided diverticulosis, 83mm polyp that was a typical                            adenoma on path Medicines:                Monitored Anesthesia Care Procedure:                Pre-Anesthesia Assessment:                     - Prior to the procedure, a History and Physical                            was performed, and patient medications and                            allergies were reviewed. The patient's tolerance of                            previous anesthesia was also reviewed. The risks                            and benefits of the procedure and the sedation                            options and risks were discussed with the patient.                            All questions were answered, and informed consent                            was obtained. Prior Anticoagulants: The patient has                            taken no previous anticoagulant or antiplatelet                            agents. ASA Grade Assessment: II - A patient with                            mild systemic disease. After reviewing the risks                            and benefits, the patient was deemed in  satisfactory condition to undergo the procedure.                           After obtaining informed consent, the colonoscope                            was passed under direct vision. Throughout the                            procedure, the patient's blood pressure, pulse, and                            oxygen saturations were monitored continuously. The                            Colonoscope was introduced through the anus and                            advanced to the the cecum, identified by                            appendiceal orifice and ileocecal valve. The                            colonoscopy was performed without difficulty. The                            patient tolerated the procedure well. The quality                            of the bowel preparation was good. The ileocecal                            valve, appendiceal orifice, and rectum were                            photographed. Scope In: 8:03:23 AM Scope Out: 8:22:53 AM Scope Withdrawal Time: 0 hours 14 minutes  47 seconds  Total Procedure Duration: 0 hours 19 minutes 30 seconds  Findings:                 A 3 mm polyp was found in the cecum. The polyp was                            sessile. The polyp was removed with a cold snare.                            Resection and retrieval were complete.                           Left sided colo-colonic anastomosis around 20cm,                            normal appearing.  The exam was otherwise without abnormality on                            direct and retroflexion views. Complications:            No immediate complications. Estimated blood loss:                            None. Estimated Blood Loss:     Estimated blood loss: none. Impression:               - One 3 mm polyp in the cecum, removed with a cold                            snare. Resected and retrieved.                           - Left sided colo-colonic anastomosis around 20cm,                            normal appearing.                           - The examination was otherwise normal on direct                            and retroflexion views. Recommendation:           - Patient has a contact number available for                            emergencies. The signs and symptoms of potential                            delayed complications were discussed with the                            patient. Return to normal activities tomorrow.                            Written discharge instructions were provided to the                            patient.                           - Resume previous diet.                           - Continue present medications. Continue daily                            fiber supplement and stay hydrated.                           - Await pathology results. Milus Banister, MD 04/18/2019 8:26:31 AM This report has been signed electronically.

## 2019-04-18 NOTE — Patient Instructions (Signed)
Read all handouts given to you by your recovery room nurse.  Thank-you for choosing [facility] for your medical needs today.  YOU HAD AN ENDOSCOPIC PROCEDURE TODAY AT [facility]:   Refer to the procedure report that was given to you for any specific questions about what was found during the examination.  If the procedure report does not answer your questions, please call your gastroenterologist to clarify.  If you requested that your care partner not be given the details of your procedure findings, then the procedure report has been included in a sealed envelope for you to review at your convenience later.  YOU SHOULD EXPECT: Some feelings of bloating in the abdomen. Passage of more gas than usual.  Walking can help get rid of the air that was put into your GI tract during the procedure and reduce the bloating. If you had a lower endoscopy (such as a colonoscopy or flexible sigmoidoscopy) you may notice spotting of blood in your stool or on the toilet paper. If you underwent a bowel prep for your procedure, you may not have a normal bowel movement for a few days.  Please Note:  You might notice some irritation and congestion in your nose or some drainage.  This is from the oxygen used during your procedure.  There is no need for concern and it should clear up in a day or so.  SYMPTOMS TO REPORT IMMEDIATELY:   Following lower endoscopy (colonoscopy or flexible sigmoidoscopy):  Excessive amounts of blood in the stool  Significant tenderness or worsening of abdominal pains  Swelling of the abdomen that is new, acute  Fever of 100F or higher   For urgent or emergent issues, a gastroenterologist can be reached at any hour by calling [phone].   DIET:  We do recommend a small meal at first, but then you may proceed to your regular diet.  Drink plenty of fluids but you should avoid alcoholic beverages for 24 hours. Try to increase the fiber in your diet, and drink plenty of water.    ACTIVITY:  You should plan to take it easy for the rest of today and you should NOT DRIVE or use heavy machinery until tomorrow (because of the sedation medicines used during the test).    FOLLOW UP: Our staff will call the number listed on your records 48-72 hours following your procedure to check on you and address any questions or concerns that you may have regarding the information given to you following your procedure. If we do not reach you, we will leave a message.  We will attempt to reach you two times.  During this call, we will ask if you have developed any symptoms of COVID 19. If you develop any symptoms (ie: fever, flu-like symptoms, shortness of breath, cough etc.) before then, please call [phone].  If you test positive for Covid 19 in the 2 weeks post procedure, please call and report this information to [place].    If any biopsies were taken you will be contacted by phone or by letter within the next 1-3 weeks.  Please call us at [phone] if you have not heard about the biopsies in 3 weeks.    SIGNATURES/CONFIDENTIALITY: You and/or your care partner have signed paperwork which will be entered into your electronic medical record.  These signatures attest to the fact that that the information above on your After Visit Summary has been reviewed and is understood.  Full responsibility of the confidentiality of  this discharge information lies with you and/or your care-partner.

## 2019-04-20 ENCOUNTER — Telehealth: Payer: Self-pay

## 2019-04-20 ENCOUNTER — Encounter: Payer: Self-pay | Admitting: Gastroenterology

## 2019-04-20 NOTE — Telephone Encounter (Signed)
  Follow up Call-  Call back number 04/18/2019  Post procedure Call Back phone  # [phone]  Permission to leave phone message Yes  Some recent data might be hidden     Patient questions:  Do you have a fever, pain , or abdominal swelling? No. Pain Score  0 *  Have you tolerated food without any problems? Yes.    Have you been able to return to your normal activities? Yes.    Do you have any questions about your discharge instructions: Diet   No. Medications  No. Follow up visit  No.  Do you have questions or concerns about your Care? No.  Actions: * If pain score is 4 or above: No action needed, pain <4.  1. Have you developed a fever since your procedure? No  2.   Have you had an respiratory symptoms (SOB or cough) since your procedure? No  3.   Have you tested positive for COVID 19 since your procedure No 4.   Have you had any family members/close contacts diagnosed with the COVID 19 since your procedure?  No  If yes to any of these questions please route to Joylene John, RN and Alphonsa Gin, RN.

## 2019-04-26 DIAGNOSIS — R69 Illness, unspecified: Secondary | ICD-10-CM | POA: Diagnosis not present

## 2019-05-09 DIAGNOSIS — G4733 Obstructive sleep apnea (adult) (pediatric): Secondary | ICD-10-CM | POA: Diagnosis not present

## 2019-06-02 DIAGNOSIS — R079 Chest pain, unspecified: Secondary | ICD-10-CM | POA: Diagnosis not present

## 2019-06-03 DIAGNOSIS — R079 Chest pain, unspecified: Secondary | ICD-10-CM | POA: Diagnosis not present

## 2019-06-03 DIAGNOSIS — K219 Gastro-esophageal reflux disease without esophagitis: Secondary | ICD-10-CM | POA: Diagnosis not present

## 2019-06-09 DIAGNOSIS — G4733 Obstructive sleep apnea (adult) (pediatric): Secondary | ICD-10-CM | POA: Diagnosis not present

## 2019-06-23 DIAGNOSIS — H401131 Primary open-angle glaucoma, bilateral, mild stage: Secondary | ICD-10-CM | POA: Diagnosis not present

## 2019-07-07 ENCOUNTER — Other Ambulatory Visit (HOSPITAL_COMMUNITY): Payer: Self-pay | Admitting: Internal Medicine

## 2019-07-07 DIAGNOSIS — R079 Chest pain, unspecified: Secondary | ICD-10-CM

## 2019-07-11 DIAGNOSIS — C61 Malignant neoplasm of prostate: Secondary | ICD-10-CM | POA: Diagnosis not present

## 2019-07-12 DIAGNOSIS — G4733 Obstructive sleep apnea (adult) (pediatric): Secondary | ICD-10-CM | POA: Diagnosis not present

## 2019-07-13 ENCOUNTER — Other Ambulatory Visit (HOSPITAL_COMMUNITY): Payer: Self-pay | Admitting: Internal Medicine

## 2019-07-13 DIAGNOSIS — R079 Chest pain, unspecified: Secondary | ICD-10-CM

## 2019-07-18 DIAGNOSIS — N393 Stress incontinence (female) (male): Secondary | ICD-10-CM | POA: Diagnosis not present

## 2019-07-18 DIAGNOSIS — C61 Malignant neoplasm of prostate: Secondary | ICD-10-CM | POA: Diagnosis not present

## 2019-07-18 DIAGNOSIS — N5201 Erectile dysfunction due to arterial insufficiency: Secondary | ICD-10-CM | POA: Diagnosis not present

## 2019-07-18 DIAGNOSIS — C642 Malignant neoplasm of left kidney, except renal pelvis: Secondary | ICD-10-CM | POA: Diagnosis not present

## 2019-07-22 ENCOUNTER — Telehealth (HOSPITAL_COMMUNITY): Payer: Self-pay

## 2019-07-22 NOTE — Telephone Encounter (Signed)
Encounter complete. 

## 2019-07-23 ENCOUNTER — Other Ambulatory Visit (HOSPITAL_COMMUNITY)
Admission: RE | Admit: 2019-07-23 | Discharge: 2019-07-23 | Disposition: A | Discharge status: Home / Self Care | Payer: Medicare HMO | Source: Ambulatory Visit | Attending: Internal Medicine | Admitting: Internal Medicine

## 2019-07-23 DIAGNOSIS — Z20828 Contact with and (suspected) exposure to other viral communicable diseases: Secondary | ICD-10-CM | POA: Diagnosis not present

## 2019-07-23 DIAGNOSIS — Z01812 Encounter for preprocedural laboratory examination: Secondary | ICD-10-CM | POA: Diagnosis not present

## 2019-07-25 LAB — NOVEL CORONAVIRUS, NAA (HOSP ORDER, SEND-OUT TO REF LAB; TAT 18-24 HRS): SARS-CoV-2, NAA: NOT DETECTED

## 2019-07-27 ENCOUNTER — Other Ambulatory Visit: Payer: Self-pay

## 2019-07-27 ENCOUNTER — Ambulatory Visit (HOSPITAL_COMMUNITY)
Admission: RE | Admit: 2019-07-27 | Discharge: 2019-07-27 | Disposition: A | Discharge status: Home / Self Care | Payer: Medicare HMO | Source: Ambulatory Visit | Attending: Cardiology | Admitting: Cardiology

## 2019-07-27 DIAGNOSIS — R079 Chest pain, unspecified: Secondary | ICD-10-CM | POA: Insufficient documentation

## 2019-07-27 LAB — EXERCISE TOLERANCE TEST
Estimated workload: 10.5 METS
Exercise duration (min): 9 min
Exercise duration (sec): 18 s
MPHR: 152 {beats}/min
Peak HR: 169 {beats}/min
Percent HR: 111 %
RPE: 17
Rest HR: 75 {beats}/min

## 2019-09-05 ENCOUNTER — Encounter: Payer: Self-pay | Admitting: Adult Health

## 2019-09-06 DIAGNOSIS — G4733 Obstructive sleep apnea (adult) (pediatric): Secondary | ICD-10-CM | POA: Diagnosis not present

## 2020-01-16 ENCOUNTER — Encounter: Payer: Self-pay | Admitting: Adult Health

## 2020-01-17 ENCOUNTER — Other Ambulatory Visit: Payer: Self-pay | Admitting: Adult Health

## 2020-01-17 DIAGNOSIS — Z1159 Encounter for screening for other viral diseases: Secondary | ICD-10-CM

## 2020-01-17 NOTE — Progress Notes (Signed)
Hepatitis C screen added to lab panel at patient request.  Wilber Bihari, NP

## 2020-02-02 ENCOUNTER — Other Ambulatory Visit: Payer: Self-pay

## 2020-02-02 ENCOUNTER — Inpatient Hospital Stay: Payer: Medicare HMO | Attending: Adult Health

## 2020-02-02 ENCOUNTER — Encounter: Payer: Self-pay | Admitting: Adult Health

## 2020-02-02 DIAGNOSIS — Z85038 Personal history of other malignant neoplasm of large intestine: Secondary | ICD-10-CM | POA: Diagnosis present

## 2020-02-02 DIAGNOSIS — Z1159 Encounter for screening for other viral diseases: Secondary | ICD-10-CM

## 2020-02-02 LAB — HEPATITIS C ANTIBODY: HCV Ab: NONREACTIVE

## 2020-02-02 LAB — CBC WITH DIFFERENTIAL (CANCER CENTER ONLY)
Abs Immature Granulocytes: 0.01 10*3/uL (ref 0.00–0.07)
Basophils Absolute: 0.1 10*3/uL (ref 0.0–0.1)
Basophils Relative: 1 %
Eosinophils Absolute: 0.2 10*3/uL (ref 0.0–0.5)
Eosinophils Relative: 4 %
HCT: 44.3 % (ref 39.0–52.0)
Hemoglobin: 15.1 g/dL (ref 13.0–17.0)
Immature Granulocytes: 0 %
Lymphocytes Relative: 30 %
Lymphs Abs: 1.6 10*3/uL (ref 0.7–4.0)
MCH: 31.4 pg (ref 26.0–34.0)
MCHC: 34.1 g/dL (ref 30.0–36.0)
MCV: 92.1 fL (ref 80.0–100.0)
Monocytes Absolute: 0.7 10*3/uL (ref 0.1–1.0)
Monocytes Relative: 12 %
Neutro Abs: 3 10*3/uL (ref 1.7–7.7)
Neutrophils Relative %: 53 %
Platelet Count: 220 10*3/uL (ref 150–400)
RBC: 4.81 MIL/uL (ref 4.22–5.81)
RDW: 12.9 % (ref 11.5–15.5)
WBC Count: 5.5 10*3/uL (ref 4.0–10.5)
nRBC: 0 % (ref 0.0–0.2)

## 2020-02-02 LAB — CMP (CANCER CENTER ONLY)
ALT: 20 U/L (ref 0–44)
AST: 23 U/L (ref 15–41)
Albumin: 4.2 g/dL (ref 3.5–5.0)
Alkaline Phosphatase: 55 U/L (ref 38–126)
Anion gap: 9 (ref 5–15)
BUN: 12 mg/dL (ref 8–23)
CO2: 29 mmol/L (ref 22–32)
Calcium: 9.5 mg/dL (ref 8.9–10.3)
Chloride: 102 mmol/L (ref 98–111)
Creatinine: 0.89 mg/dL (ref 0.61–1.24)
GFR, Est AFR Am: >60 mL/min (ref >60)
GFR, Estimated: >60 mL/min (ref >60)
Glucose, Bld: 91 mg/dL (ref 70–99)
Potassium: 3.8 mmol/L (ref 3.5–5.1)
Sodium: 140 mmol/L (ref 135–145)
Total Bilirubin: 0.7 mg/dL (ref 0.3–1.2)
Total Protein: 7.2 g/dL (ref 6.5–8.1)

## 2020-02-02 LAB — LACTATE DEHYDROGENASE: LDH: 167 U/L (ref 98–192)

## 2020-02-02 LAB — CEA (IN HOUSE-CHCC): CEA (CHCC-In House): 4.42 ng/mL (ref 0.00–5.00)

## 2020-02-09 ENCOUNTER — Inpatient Hospital Stay (HOSPITAL_BASED_OUTPATIENT_CLINIC_OR_DEPARTMENT_OTHER): Payer: Medicare HMO | Admitting: Adult Health

## 2020-02-09 DIAGNOSIS — Z85038 Personal history of other malignant neoplasm of large intestine: Secondary | ICD-10-CM

## 2020-02-09 MED ORDER — PREDNISONE 50 MG PO TABS: ORAL_TABLET | ORAL | 0 refills | Status: DC

## 2020-02-09 NOTE — Progress Notes (Signed)
CLINIC:  Survivorship   I connected with Scott [place]den on 02/09/19 by my chart video and verified that I am speaking with the correct person using two identifiers.  I discussed the limitations, risks, security and privacy concerns of performing an evaluation and management service by my chart video and the availability of in person appointments.  I also discussed with the patient that there may be a patient responsible charge related to this service. The patient expressed understanding and agreed to proceed.    REASON FOR VISIT:  Routine follow-up for history of colon cancer cancer.   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 He received 12 cycles of FOLFOX chemotherapy over 6 months. He developed a severe distal neuropathy, upper and lower extremities.  During his initial staging evaluation, CT scan revealed a 2 cm lesion in his left kidney. When he recovered from chemotherapy for the colon cancer he underwent a right robotic partial left nephrectomy 03/21/2010. Pathology showed a 2 cm clear cell grade 2 lesion with no vascular invasion. He has a history of BPH and an elevated PSA. Biopsies in the past were negative for cancer. In June of 2015, he underwent robotic prostatectomy and was noted to have bilateral disease in the prostate. Repeat colonoscopy in November 2015 only show 1 Polyp and biopsy was benign  INTERVAL HISTORY:  Mr. Mchan presents to the [facility] today for routine follow-up for his history of colon cancer.  [place] Scott is doing well.  Last year, he had developed some stool changes.  I referred him back to Dr. Ardis Hughs who evaluated him and performed his colonoscopy.    Hayden Scott has been doing well.  He  denies any new major issues.  We have been following his CEA which has been slowly increasing, but remains below 5.    Results for FLEMING, PRILL (MRN [id number]) as of 02/11/2020 04:29  Ref. Range 01/01/2017 15:18 01/12/2018 11:53 02/02/2019 09:29 02/02/2020 11:19  CEA ([facility]) Latest Ref Range: 0.00 - 5.00 ng/mL 3.02 3.09 3.36 4.42   He does have a mild fatigue and generalized achiness that occurs from time to time.    REVIEW OF SYSTEMS:  Review of Systems  Constitutional: Positive for fatigue. Negative for appetite change, chills, fever and unexpected weight change.  HENT:   Negative for hearing loss, lump/mass, sore throat and trouble swallowing.   Eyes: Negative for eye problems and icterus.  Respiratory: Negative for chest tightness, cough and shortness of breath.   Cardiovascular: Negative for chest pain, leg swelling and palpitations.  Gastrointestinal: Negative for abdominal distention, abdominal pain, blood in stool, constipation, diarrhea, nausea, rectal pain and vomiting.  Endocrine: Negative for hot flashes.  Genitourinary: Negative for difficulty urinating.   Musculoskeletal: Negative for arthralgias.  Skin: Negative for itching and rash.  Neurological: Negative for dizziness, extremity weakness, headaches and numbness.  Hematological: Negative for adenopathy. Does not bruise/bleed easily.  Psychiatric/Behavioral: Negative for depression. The patient is not nervous/anxious.      PAST MEDICAL/SURGICAL HISTORY:  Past Medical History:  Diagnosis Date  . Allergy   . BPH (benign prostatic hypertrophy)   . Cancer (Scott [place]den)    colon cancer 2010, kidney cancer 2010  . 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 Colon cancer (Scott [place])   . Elevated PSA   . GERD (gastroesophageal reflux disease)   . Glaucoma 06/09/2012  . Heart murmur    as a child   . History of colon cancer 05/31/2012  . History of  colon cancer, stage III ONCOLOGIST--  DR Allayne Gitelman--  NO RECURRENCE   SIGMOID COLON CA  STAGE IIIA ,  T2, N1--  S/P COLONECTOMY AND CHEMOTHERAPY  . History of colon polyps   . History of gastric polyp   . History of kidney cancer 05/31/2012  . History of prostate cancer 01/27/2014  . History of renal cell carcinoma 05-31-2012   STAGE I  GRADE 2  FUHRMAN--  S/P PARTIAL LEFT NEPHRECOTMY WITH NEGATIVE MARGINS  . Hyperlipidemia   . Hypertension   . Neuromuscular disorder (Scott Hayden)    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   . OSA on CPAP    cpap settings- 10   . Pneumonia    hx of   . Prostate cancer (Scott [place]) 2015   s/p prostatectomy  . Pulmonary nodule    RIGHT MIDDLE LOBE PER CT 10-26-2013  STABLE  . Sleep apnea    cpap  . Wears glasses   . Wears hearing aid    BILATERAL   Past Surgical History:  Procedure Laterality Date  . CATARACT EXTRACTION W/ INTRAOCULAR LENS  IMPLANT, BILATERAL  04/2011 & 12/2011  . INGUINAL HERNIA REPAIR Left 01/1977  . KNEE ARTHROSCOPY Left 09/2007  . LAPAROSCOPIC SIGMOID COLECTOMY  03/09/2009  ([place])  . LYMPHADENECTOMY Bilateral 01/27/2014   Procedure: BILATERAL PELVIC LYMPH NODE DISSECTION;  Surgeon: Alexis Frock, MD;  Location: [facility];  Service: Urology;  Laterality: Bilateral;  . PROSTATE BIOPSY N/A 11/30/2013   Procedure: SATURATION BIOPSY TRANSRECTAL ULTRASONIC PROSTATE (TUBP);  Surgeon: Alexis Frock, MD;  Location: [facility];  Service: Urology;  Laterality: N/A;  . ROBOT ASSISTED LAPAROSCOPIC RADICAL PROSTATECTOMY N/A 01/27/2014   Procedure: ROBOTIC ASSISTED LAPAROSCOPIC RADICAL PROSTATECTOMY WITH INDOCYANINE GREEN DYE,EXTENSIVE ADHESIOLYSIS " PRE -PERITONEAL";  Surgeon: Alexis Frock, MD;  Location: [facility];  Service: Urology;  Laterality: N/A;  . ROBOTIC ASSITED PARTIAL NEPHRECTOMY Left 03-21-2012     ALLERGIES:  Allergies  Allergen Reactions  . Shellfish Allergy Swelling    Eye puffiness  . Contrast Media [Iodinated  Diagnostic Agents] Rash    Erythema on chest w/ scans in [place], pt did fine with 13hr prep  . Ultravist [Iopromide] Rash    Rash on chest     CURRENT MEDICATIONS:  Outpatient Encounter Medications as of 02/09/2020  Medication Sig  . aspirin 81 MG tablet Take 81 mg by mouth daily.  . calcium carbonate (OS-CAL) 600 MG TABS tablet Take 600 mg by mouth daily with breakfast.  . cetirizine (ZYRTEC) 10 MG tablet Take 10 mg by mouth daily as needed for allergies.  Marland Kitchen esomeprazole (NEXIUM) 40 MG capsule Take 40 mg by mouth every evening.   . fenofibrate (TRICOR) 145 MG tablet Take 145 mg by mouth every evening.   . hydrochlorothiazide (MICROZIDE) 12.5 MG capsule Take 12.5 mg by mouth every morning.   . latanoprost (XALATAN) 0.005 % ophthalmic solution Place 1 drop into both eyes at bedtime.   . metoprolol succinate (TOPROL-XL) 100 MG 24 hr tablet Take 100 mg by mouth every morning. Take with or immediately following a meal.  . Multiple Vitamin (MULTIVITAMIN) tablet Take 1 tablet by mouth daily.  Marland Kitchen  oxybutynin (DITROPAN-XL) 10 MG 24 hr tablet TK 1 T PO Q 8 H PRN  . polyethylene glycol-electrolytes (NULYTELY/GOLYTELY) 420 g solution Take 4,000 mLs by mouth as directed.  . predniSONE (DELTASONE) 50 MG tablet One tab 13, 7, and 1 hour prior to CT scan.   No facility-administered encounter medications on file as of 02/09/2020.     ONCOLOGIC FAMILY HISTORY:  Family History  Problem Relation Age of Onset  . Stomach cancer Mother 63  . Hodgkin's lymphoma Father   . Prostate cancer Father   . Colon cancer Neg Hx   . Rectal cancer Neg Hx   . Esophageal cancer Neg Hx    Genetics: no mutations identified   SOCIAL HISTORY:  Scott Hayden is married and lives with his wife in [place].  Mr. Releford  has 2 daughters and they live in [place] and the other in [place].  Currently retired.  Denies any current or history of tobacco, alcohol, or illicit drug use. (Reviewed 01/2020 and  unchanged)   OBJECTIVE:  Hayden Scott appears well.  He is in no apparent distress.  His breathing is non labored.  Skin visualized without rash or lesion.  Mood and behavior are normal.    LABORATORY DATA:   Appointment on 02/02/2020  Component Date Value Ref Range Status  . WBC Count 02/02/2020 5.5  4.0 - 10.5 K/uL Final  . RBC 02/02/2020 4.81  4.22 - 5.81 MIL/uL Final  . Hemoglobin 02/02/2020 15.1  13.0 - 17.0 g/dL Final  . HCT 02/02/2020 44.3  39 - 52 % Final  . MCV 02/02/2020 92.1  80.0 - 100.0 fL Final  . MCH 02/02/2020 31.4  26.0 - 34.0 pg Final  . MCHC 02/02/2020 34.1  30.0 - 36.0 g/dL Final  . RDW 02/02/2020 12.9  11.5 - 15.5 % Final  . Platelet Count 02/02/2020 220  150 - 400 K/uL Final  . nRBC 02/02/2020 0.0  0.0 - 0.2 % Final  . Neutrophils Relative % 02/02/2020 53  % Final  . Neutro Abs 02/02/2020 3.0  1.7 - 7.7 K/uL Final  . Lymphocytes Relative 02/02/2020 30  % Final  . Lymphs Abs 02/02/2020 1.6  0.7 - 4.0 K/uL Final  . Monocytes Relative 02/02/2020 12  % Final  . Monocytes Absolute 02/02/2020 0.7  0 - 1 K/uL Final  . Eosinophils Relative 02/02/2020 4  % Final  . Eosinophils Absolute 02/02/2020 0.2  0 - 0 K/uL Final  . Basophils Relative 02/02/2020 1  % Final  . Basophils Absolute 02/02/2020 0.1  0 - 0 K/uL Final  . Immature Granulocytes 02/02/2020 0  % Final  . Abs Immature Granulocytes 02/02/2020 0.01  0.00 - 0.07 K/uL Final   Performed at [facility], [place] [address] [place], [place] [address]  . Sodium 02/02/2020 140  135 - 145 mmol/L Final  . Potassium 02/02/2020 3.8  3.5 - 5.1 mmol/L Final  . Chloride 02/02/2020 102  98 - 111 mmol/L Final  . CO2 02/02/2020 29  22 - 32 mmol/L Final  . Glucose, Bld 02/02/2020 91  70 - 99 mg/dL Final   Glucose reference range applies only to samples taken after fasting for at least 8 hours.  . BUN 02/02/2020 12  8 - 23 mg/dL Final  . Creatinine 02/02/2020 0.89  0.61 - 1.24 mg/dL Final  . Calcium 02/02/2020  9.5  8.9 - 10.3 mg/dL Final  . Total Protein 02/02/2020 7.2  6.5 - 8.1  g/dL Final  . Albumin 02/02/2020 4.2  3.5 - 5.0 g/dL Final  . AST 02/02/2020 23  15 - 41 U/L Final  . ALT 02/02/2020 20  0 - 44 U/L Final  . Alkaline Phosphatase 02/02/2020 55  38 - 126 U/L Final  . Total Bilirubin 02/02/2020 0.7  0.3 - 1.2 mg/dL Final  . GFR, Est Non Af Am 02/02/2020 >60  >60 mL/min Final  . GFR, Est AFR Am 02/02/2020 >60  >60 mL/min Final  . Anion gap 02/02/2020 9  5 - 15 Final   Performed at [facility], [place] [address] [place], [place] [address]  . LDH 02/02/2020 167  98 - 192 U/L Final   Performed at [facility], [place] [address] [place], [place] [address]  . CEA ([facility]) 02/02/2020 4.42  0.00 - 5.00 ng/mL Final   Comment: (NOTE) This test was performed using Architect's Chemiluminescent Microparticle Immunoassay. Values obtained from different assay methods cannot be used interchangeably. Please note that 5-10% of patients who smoke may see CEA levels up to 6.9 ng/mL. Performed at [facility], [place] [address], [place], [place] [address]   . HCV Ab 02/02/2020 NON REACTIVE  NON REACTIVE Final   Comment: (NOTE) Nonreactive HCV antibody screen is consistent with no HCV infections,  unless recent infection is suspected or other evidence exists to indicate HCV infection.  Performed at [facility], [place] [address] [place], [place] [address]     DIAGNOSTIC IMAGING:  None for this visit     ASSESSMENT AND PLAN:  Mr.. Trethewey is a pleasant 69 y.o. male with history of Stage IIIA colon cancer, diagnosed in 2010; treated with colectomy and 12 cycles of FOLFOX chemotherapy. Mr.Bastos presents to the [facility] for surveillance and routine follow-up.   1. History of Stage IIIA colon cancer:  Mr. Lantzy is currently clinically without evidence of cancer recurrence.  His labs, though stable, are creeping up,  and with his generalized vague symptoms, we will repeat scans with a CT chest, abdomen, and pelvis, due to his h/o of having more than once primary cancer.  We will go ahead and set up to see him back in 1 year for labs and Survivorship Visit.   I encouraged him to call me with any questions or concerns before his next visit at the cancer center, and I would be happy to see the patient sooner, if needed.    2. Cancer screenings:  Due to Mr. Geffre history and age, he should receive screening for skin cancers.  He should follow up with GI for his colonoscopy (completed this year); and urology for his h/o prostate cancer and kidney cancer as recommended by Dr. Ardis Hughs and Dr. Tresa Moore. The patient was encouraged to follow-up with his PCP regularly.   3. Health maintenance and wellness promotion: Mr. Dyal was encouraged to consume 5-7 servings of fruits and vegetables per day. The patient was also encouraged to engage in moderate to vigorous exercise for 30 minutes per day most days of the week. Mr. Jodoin was instructed to limit his alcohol consumption and continue to abstain from tobacco use.   Follow up instructions:  -Return to cancer center to see Survivorship NP in one year with labs prior -CT chest/abdomen/pelvis   The patient was provided an opportunity to ask questions and all were answered. The patient agreed with the plan and demonstrated an understanding of the instructions.   The  patient was advised to call back or seek an in-person evaluation if the symptoms worsen or if the condition fails to improve as anticipated.   Total encounter time: 20 minutes*   Gardenia Phlegm, NP [place] [phone]  *Total Encounter Time as defined by the Centers for Medicare and Medicaid Services includes, in addition to the face-to-face time of a patient visit (documented in the note above) non-face-to-face time: obtaining and reviewing outside history,  ordering and reviewing medications, tests or procedures, care coordination (communications with other health care professionals or caregivers) and documentation in the medical record.    Note: PRIMARY CARE PROVIDER Deland Pretty, [place] [phone]

## 2020-02-14 ENCOUNTER — Telehealth: Payer: Self-pay | Admitting: Hematology and Oncology

## 2020-02-14 ENCOUNTER — Ambulatory Visit (HOSPITAL_COMMUNITY): Payer: Medicare HMO

## 2020-02-14 NOTE — Telephone Encounter (Signed)
[id number] Faxed medical records to [facility] @ [phone]

## 2020-02-16 ENCOUNTER — Telehealth: Payer: Self-pay | Admitting: Adult Health

## 2020-02-16 NOTE — Telephone Encounter (Signed)
Peer to peer with Dr. Purvis Kilts.  CT chest abdomen pelvis with contrast approved [id number], expires 180 calendar days from today.    Wilber Bihari, NP

## 2020-02-17 ENCOUNTER — Encounter (HOSPITAL_COMMUNITY): Payer: Self-pay

## 2020-02-17 ENCOUNTER — Other Ambulatory Visit: Payer: Self-pay

## 2020-02-17 ENCOUNTER — Encounter: Payer: Self-pay | Admitting: Adult Health

## 2020-02-17 ENCOUNTER — Telehealth: Payer: Self-pay | Admitting: Adult Health

## 2020-02-17 ENCOUNTER — Ambulatory Visit (HOSPITAL_COMMUNITY)
Admission: RE | Admit: 2020-02-17 | Discharge: 2020-02-17 | Disposition: A | Discharge status: Home / Self Care | Payer: Medicare HMO | Source: Ambulatory Visit | Attending: Adult Health | Admitting: Adult Health

## 2020-02-17 DIAGNOSIS — Z85038 Personal history of other malignant neoplasm of large intestine: Secondary | ICD-10-CM | POA: Diagnosis present

## 2020-02-17 MED ORDER — SODIUM CHLORIDE (PF) 0.9 % IJ SOLN
INTRAMUSCULAR | Status: AC
  Filled 2020-02-17: qty 50

## 2020-02-17 MED ORDER — IOHEXOL 300 MG/ML  SOLN
100.0000 mL | Freq: Once | INTRAMUSCULAR | Status: AC | PRN
  Administered 2020-02-17: 100 mL via INTRAVENOUS

## 2020-02-17 NOTE — Telephone Encounter (Signed)
Called and LMOM with results.  No cancer, but ? 1.1 cm bladder stone.  I recommended he review with urology.    Wilber Bihari, NP

## 2020-02-21 ENCOUNTER — Telehealth: Payer: Self-pay

## 2020-02-21 NOTE — Telephone Encounter (Signed)
Returned call to pt per Wilber Bihari, NP's message. Pt understands he should f/u with Dr Tresa Moore, urologist, and to call us back with any questions/concerns.

## 2020-05-11 ENCOUNTER — Encounter: Payer: Self-pay | Admitting: Adult Health

## 2022-04-01 ENCOUNTER — Ambulatory Visit: Payer: Medicare HMO | Admitting: Gastroenterology

## 2022-04-01 ENCOUNTER — Encounter: Payer: Self-pay | Admitting: Gastroenterology

## 2022-04-01 VITALS — BP 122/80 | HR 74 | Ht 72.0 in | Wt 259.0 lb

## 2022-04-01 DIAGNOSIS — K5900 Constipation, unspecified: Secondary | ICD-10-CM | POA: Diagnosis not present

## 2022-04-01 DIAGNOSIS — Z86010 Personal history of colonic polyps: Secondary | ICD-10-CM

## 2022-04-01 DIAGNOSIS — Z85038 Personal history of other malignant neoplasm of large intestine: Secondary | ICD-10-CM

## 2022-04-01 DIAGNOSIS — R194 Change in bowel habit: Secondary | ICD-10-CM | POA: Diagnosis not present

## 2022-04-01 DIAGNOSIS — K219 Gastro-esophageal reflux disease without esophagitis: Secondary | ICD-10-CM

## 2022-04-01 MED ORDER — CLENPIQ 10-3.5-12 MG-GM -GM/160ML PO SOLN: 1.0000 | ORAL | 0 refills | Status: DC

## 2022-04-01 NOTE — Patient Instructions (Signed)
If you are age 71 or older, your body mass index should be between 23-30. Your Body mass index is 35.13 kg/m. If this is out of the aforementioned range listed, please consider follow up with your Primary Care Provider. ________________________________________________________  The [facility] providers would like to encourage you to use [facility] to communicate with providers for non-urgent requests or questions.  Due to long hold times on the telephone, sending your provider a message by [facility] may be a faster and more efficient way to get a response.  Please allow 48 business hours for a response.  Please remember that this is for non-urgent requests.  _______________________________________________________  Dennis Bast have been scheduled for a colonoscopy. Please follow written instructions given to you at your visit today.  Please pick up your prep supplies at the pharmacy within the next 1-3 days. If you use inhalers (even only as needed), please bring them with you on the day of your procedure.  You have been scheduled to have an anorectal manometry at [facility] on 08/06/2022 at 8:30 am. Please arrive 30 minutes prior to your appointment time for registration (1st floor of the hospital-admissions).  Please make certain to use 1 Fleets enema 2 hours prior to coming for your appointment. You can purchase Fleets enemas from the laxative section at your drug store. You should not eat anything during the two hours prior to the procedure. You may take regular medications with small sips of water at least 2 hours prior to the study.  Anorectal manometry is a test performed to evaluate patients with constipation or fecal incontinence. This test measures the pressures of the anal sphincter muscles, the sensation in the rectum, and the neural reflexes that are needed for normal bowel movements.  THE PROCEDURE The test takes approximately 30 minutes to 1 hour. You will be asked to change into a  hospital gown. A technician or nurse will explain the procedure to you, take a brief health history, and answer any questions you may have. The patient then lies on his or her left side. A small, flexible tube, about the size of a thermometer, with a balloon at the end is inserted into the rectum. The catheter is connected to a machine that measures the pressure. During the test, the small balloon attached to the catheter may be inflated in the rectum to assess the normal reflex pathways. The nurse or technician may also ask the person to squeeze, relax, and push at various times. The anal sphincter muscle pressures are measured during each of these maneuvers. To squeeze, the patient tightens the sphincter muscles as if trying to prevent anything from coming out. To push or bear down, the patient strains down as if trying to have a bowel movement.  We have given you a kit to complete your sitzmarker. Please follow instructions that are provided inside your kit. Once you take the pill that is provided in your kit, you will return to our basement 5 days later for an abdominal x-ray.  We will contact you about you Colonoscopy, Sitzmarker, and you Ano Rectal Manometry. We will discuss follow ups at that time.  Thank you for entrusting me with your care and choosing [facility].  Dr Bryan Lemma

## 2022-04-01 NOTE — Progress Notes (Signed)
Chief Complaint:    Change in bowel habits, constipation  GI History: 71 year old male with a history of HTN, HLD, OSA on CPAP, renal clear-cell cancer s/p nephrectomy 2011, colon cancer  s/p sigmoid resection, Prostate CA s/p prostatectomy 2015, follows in GI clinic for the following:  1. Sigmoid colon cancer Colonoscopy July 2010 done for "polyp history" by Dr. Nobie Putnam in [place], [place]: Findings sigmoid diverticulosis, internal hemorrhoids, ulcerated polypoid lesion at 20 cm from the anal verge. Biopsies of the 3 cm lesion in sigmoid colon showed adenocarcinoma.  Sigmoid colectomy 03/09/2009 (2 x 2.5 cm moderately differentiated adenocarcinoma, 2/13 lymph nodes positive, T2N1 arising in TVA).  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  Colonoscopy July 2011, same physician, done for history of colon cancer, findings "all the visualized mucosal surfaces appeared normal except sigmoid diverticulosis, internal hemorrhoids, the anastomosis was normal." Colonoscopy July 2012 by the same physician, done for history of colon cancer, findings "all the visualized mucosal surfaces appeared normal except the following. Sigmoid diverticulosis, the anastomosis was normal at 20 cm, no tumor was seen, positive for internal hemorrhoids.  Colonoscopy Dr. Ardis Hughs 06/2014: normal anastomosis at 20cm.  Left sided diverticulosis, 37m polyp that was a typical adenoma on path. Colonoscopy August 2020 by Dr. JArdis Hughswith 3 mm cecal adenoma, normal-appearing colo-colonic anastomosis at 20 cm.  Otherwise normal-appearing colon.    2.  Change in bowel habits 2018 started while on vacation.  Gradually improved with time and fiber supplement, but started to recur 2020.   3.  GERD. [place] February 2013 [place], same physician, showed small hiatal hernia, short segment of Barrett's-like changes, multiple polyps in the stomach. Biopsies were negative for H. pylori, they showed no  intestinal metaplasia, or polyps were fundic gland polyps.   HPI:     Patient is a 71y.o. male presenting to the Gastroenterology Clinic for follow-up.  Last seen on 03/18/2019 by Dr. JArdis Hughs  Main issue at that time was constipation with 1 BM every 3 days, and straining to have BM.  Baseline was 1 BM QOD without pushing/straining.  Colonoscopy in 03/2019 with small cecal adenoma, but otherwise healthy-appearing anastomosis.  -02/02/2020: Normal CEA, CBC, CMP -02/17/2020: CT C/A/P: Prominent stool throughout the colon, sigmoid diverticulosis, unremarkable anastomotic staple line and distal sigmoid.  Bladder calculus vs nonobstructive UVJ calculus.  Normal liver, pancreas, no lymphadenopathy  Last appointment in the oncology clinic was 02/09/2020.  Today, his main issue is again constipation and change in bowel habits.  No hematochezia or melena. Sxs have been progressive over the year.  Has maintianed a detailed diary. Taking Miralax QOD with some efficacy. More straining to have BM. Has had to do manual disimpaction.  He has heightened concerns, particularly given his history of cancer.  Review of systems:     No chest pain, no SOB, no fevers, no urinary sx   Past Medical History:  Diagnosis Date   Allergy    BPH (benign prostatic hypertrophy)    Cancer (HCC)    colon cancer 2010, kidney cancer 2010   Chemotherapy-induced neuropathy (HCC)    OXALIPLATINUM CHEMO DRUG--  BILATERAL DISTAL UPPER AND LOWER EXTREMITIES   Colon cancer (HCC)    Elevated PSA    GERD (gastroesophageal reflux disease)    Glaucoma 06/09/2012   Heart murmur    as a child    History of colon cancer 05/31/2012   History of colon  cancer, stage III ONCOLOGIST--  DR Allayne Gitelman--  NO RECURRENCE   SIGMOID COLON CA  STAGE IIIA ,  T2, N1--  S/P COLONECTOMY AND CHEMOTHERAPY   History of colon polyps    History of gastric polyp    History of kidney cancer 05/31/2012   History of prostate cancer 01/27/2014   History of renal  cell carcinoma 05-31-2012   STAGE I  GRADE 2  FUHRMAN--  S/P PARTIAL LEFT NEPHRECOTMY WITH NEGATIVE MARGINS   Hyperlipidemia    Hypertension    Neuromuscular disorder (HCC)    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     calcium carbonate (OS-CAL) 600 MG TABS tablet Take 600 mg by mouth daily with breakfast.     cetirizine (ZYRTEC) 10 MG tablet Take 10 mg by mouth daily as needed for allergies.     esomeprazole (NEXIUM) 40 MG capsule Take 40 mg by mouth every evening.      fenofibrate (TRICOR) 145 MG tablet Take 145 mg by mouth every evening.      hydrochlorothiazide (MICROZIDE) 12.5 MG capsule Take 12.5 mg by mouth every morning.      latanoprost (XALATAN) 0.005 % ophthalmic solution Place 1 drop into both eyes at bedtime.      metoprolol succinate (TOPROL-XL) 100 MG 24 hr tablet Take 100 mg by mouth every morning. Take with or immediately following a meal.     Multiple Vitamin (MULTIVITAMIN) tablet Take 1 tablet by mouth daily.     oxybutynin (DITROPAN-XL) 10 MG 24 hr tablet TK 1 T PO Q 8 H PRN  3   polyethylene glycol-electrolytes (NULYTELY/GOLYTELY) 420 g solution Take 4,000 mLs by mouth as directed. 4000 mL 0   predniSONE (DELTASONE) 50 MG tablet One tab 13, 7, and 1 hour prior to CT scan. 3 tablet 0   No current facility-administered medications for this visit.    Physical Exam:     Wt 259 lb (117.5 kg)   BMI 35.13 kg/m   GENERAL:  Pleasant male in NAD PSYCH: : Cooperative, normal  affect Musculoskeletal:  Normal muscle tone, normal strength NEURO: Alert and oriented x 3, no focal neurologic deficits   IMPRESSION and PLAN:    1) History of colon cancer 2) History of colon polyps History of sigmoid adenocarcinoma s/p sigmoid colectomy in 2010 followed by chemotherapy c/b neuropathy.  No evidence of recurrence on most recent imaging and colonoscopy in 2020.  He has elevated concerns for malignant etiology of his progressive return of constipation. -Repeat colonoscopy  3) Constipation 4) Change in bowel habits - Colonoscopy as above - Sitz marker study.  He is out of town next week.  Will send home today with sitz marker tablet.  He will call us when he is back in town with plan to order x-ray 5 days after ingestion at home.  Instructed  to hold MiraLAX (and other laxatives/stool softeners) for the duration of the study - Anorectal manometry to evaluate for pelvic floor dyssynergia - In the meantime, continue with MiraLAX and titrate to soft stools without straining to BM - Maintain adequate hydration  5) GERD - Controlled on current therapy  The indications, risks, and benefits of colonoscopy were explained to the patient in detail. Risks include but are not limited to bleeding, perforation, adverse reaction to medications, and cardiopulmonary compromise. Sequelae include but are not limited to the possibility of surgery, hospitalization, and mortality. The patient verbalized understanding and wished to proceed. All questions answered, referred to the scheduler and bowel prep ordered. Further recommendations pending results of the exam.          Lavena Bullion ,DO, FACG 04/01/2022, 10:34 AM

## 2022-04-04 ENCOUNTER — Ambulatory Visit: Payer: Medicare HMO | Admitting: Gastroenterology

## 2022-04-08 ENCOUNTER — Telehealth: Payer: Self-pay

## 2022-04-08 NOTE — Telephone Encounter (Signed)
Pt called in to check instructions for sitz marker study. Let pt know that he needs to be off laxatives and fiber for 5 days prior to swallowing the pill and remain off of laxatives/fiber 5 days after taking pill. Once he waits 5 days after taking pill pt can come in for xray. Pt verbalized understanding and had no other concerns at end of call.

## 2022-04-16 ENCOUNTER — Ambulatory Visit (INDEPENDENT_AMBULATORY_CARE_PROVIDER_SITE_OTHER)
Admission: RE | Admit: 2022-04-16 | Discharge: 2022-04-16 | Disposition: A | Discharge status: Home / Self Care | Payer: Medicare HMO | Source: Ambulatory Visit | Attending: Gastroenterology | Admitting: Gastroenterology

## 2022-04-16 DIAGNOSIS — K5900 Constipation, unspecified: Secondary | ICD-10-CM

## 2022-04-16 DIAGNOSIS — Z85038 Personal history of other malignant neoplasm of large intestine: Secondary | ICD-10-CM | POA: Diagnosis not present

## 2022-05-01 ENCOUNTER — Encounter: Payer: Self-pay | Admitting: Otolaryngology

## 2022-05-02 NOTE — Discharge Instructions (Signed)
[facility] ENDOSCOPIC SINUS SURGERY [facility]  What is Functional Endoscopic Sinus Surgery?  The Surgery involves making the natural openings of the sinuses larger by removing the bony partitions that separate the sinuses from the nasal cavity.  The natural sinus lining is preserved as much as possible to allow the sinuses to resume normal function after the surgery.  In some patients nasal polyps (excessively swollen lining of the sinuses) may be removed to relieve obstruction of the sinus openings.  The surgery is performed through the nose using lighted scopes, which eliminates the need for incisions on the face.  A septoplasty is a different procedure which is sometimes performed with sinus surgery.  It involves straightening the boy partition that separates the two sides of your nose.  A crooked or deviated septum may need repair if is obstructing the sinuses or nasal airflow.  Turbinate reduction is also often performed during sinus surgery.  The turbinates are bony proturberances from the side walls of the nose which swell and can obstruct the nose in patients with sinus and allergy problems.  Their size can be surgically reduced to help relieve nasal obstruction.  What Can Sinus Surgery Do For Me?  Sinus surgery can reduce the frequency of sinus infections requiring antibiotic treatment.  This can provide improvement in nasal congestion, post-nasal drainage, facial pressure and nasal obstruction.  Surgery will NOT prevent you from ever having an infection again, so it usually only for patients who get infections 4 or more times yearly requiring antibiotics, or for infections that do not clear with antibiotics.  It will not cure nasal allergies, so patients with allergies may still require medication to treat their allergies after surgery. Surgery may improve headaches related to sinusitis, however, some people will continue to  require medication to control sinus headaches related to allergies.  Surgery will do nothing for other forms of headache (migraine, tension or cluster).  What Are the Risks of Endoscopic Sinus Surgery?  Current techniques allow surgery to be performed safely with little risk, however, there are rare complications that patients should be aware of.  Because the sinuses are located around the eyes, there is risk of eye injury, including blindness, though again, this would be quite rare. This is usually a result of bleeding behind the eye during surgery, which can effect vision, though there are treatments to protect the vision and prevent permanent injury. More serious complications would include bleeding inside the brain cavity or damage to the brain.This happens when the fluid around the brain leaks out into the sinus cavity.  Again, all of these complications are uncommon, and spinal fluid leaks can be safely managed surgically if they occur.  The most common complication of sinus surgery is bleeding from the nose, which may require packing or cauterization of the nose.  Patients with polyps may experience recurrence of the polyps that would require revision surgery.  Alterations of sense of smell or injury to the tear ducts are also rare complications.   What is the Surgery Like, and what is the Recovery?  The Surgery usually takes a couple of hours to perform, and is usually performed under a general anesthetic (completely asleep).  Patients are usually discharged home after a couple of hours.  Sometimes during surgery it is necessary to pack the nose to control bleeding, and the packing is left in place for 24 - 48 hours, and removed by your surgeon.  If   a septoplasty was performed during the procedure, there is often a splint placed which must be removed after 5-7 days.   Discomfort: Pain is usually mild to moderate, and can be controlled by prescription pain medication or acetaminophen (Tylenol).   Aspirin, Ibuprofen (Advil, Motrin), or Naprosyn (Aleve) should be avoided, as they can cause increased bleeding.  Most patients feel sinus pressure like they have a bad head cold for several days.  Sleeping with your head elevated can help reduce swelling and facial pressure, as can ice packs over the face.  A humidifier may be helpful to keep the mucous and blood from drying in the nose.   Diet: There are no specific diet restrictions, however, you should generally start with clear liquids and a light diet of bland foods because the anesthetic can cause some nausea.  Advance your diet depending on how your stomach feels.  Taking your pain medication with food will often help reduce stomach upset which pain medications can cause.  Nasal Saline Irrigation: It is important to remove blood clots and dried mucous from the nose as it is healing.  This is done by having you irrigate the nose at least 3 - 4 times daily with a salt water solution.  We recommend using NeilMed Sinus Rinse (available at the drug store).  Fill the squeeze bottle with the solution, bend over a sink, and insert the tip of the squeeze bottle into the nose  of an inch.  Point the tip of the squeeze bottle towards the inside corner of the eye on the same side your irrigating.  Squeeze the bottle and gently irrigate the nose.  If you bend forward as you do this, most of the fluid will flow back out of the nose, instead of down your throat.   The solution should be warm, near body temperature, when you irrigate.   Each time you irrigate, you should use a full squeeze bottle.   Note that if you are instructed to use Nasal Steroid Sprays at any time after your surgery, irrigate with saline BEFORE using the steroid spray, so you do not wash it all out of the nose. Another product, Nasal Saline Gel (such as AYR Nasal Saline Gel) can be applied in each nostril 3 - 4 times daily to moisture the nose and reduce scabbing or crusting.  Bleeding:   Bloody drainage from the nose can be expected for several days, and patients are instructed to irrigate their nose frequently with salt water to help remove mucous and blood clots.  The drainage may be dark red or brown, though some fresh blood may be seen intermittently, especially after irrigation.  Do not blow you nose, as bleeding may occur. If you must sneeze, keep your mouth open to allow air to escape through your mouth.  If heavy bleeding occurs: Irrigate the nose with saline to rinse out clots, then spray the nose 3 - 4 times with Afrin Nasal Decongestant Spray.  The spray will constrict the blood vessels to slow bleeding.  Pinch the lower half of your nose shut to apply pressure, and lay down with your head elevated.  Ice packs over the nose may help as well. If bleeding persists despite these measures, you should notify your doctor.  Do not use the Afrin routinely to control nasal congestion after surgery, as it can result in worsening congestion and may affect healing.     Activity: Return to work varies among patients. Most patients will be out   of work at least 5 - 7 days to recover.  Patient may return to work after they are off of narcotic pain medication, and feeling well enough to perform the functions of their job.  Patients must avoid heavy lifting (over 10 pounds) or strenuous physical for 2 weeks after surgery, so your employer may need to assign you to light duty, or keep you out of work longer if light duty is not possible.  NOTE: you should not drive, operate dangerous machinery, do any mentally demanding tasks or make any important legal or financial decisions while on narcotic pain medication and recovering from the general anesthetic.    Call Your Doctor Immediately if You Have Any of the Following: Bleeding that you cannot control with the above measures Loss of vision, double vision, bulging of the eye or black eyes. Fever over 101 degrees Neck stiffness with severe headache,  fever, nausea and change in mental state. You are always encouraged to call anytime with concerns, however, please call with requests for pain medication refills during office hours.  Office Endoscopy: During follow-up visits your doctor will remove any packing or splints that may have been placed and evaluate and clean your sinuses endoscopically.  Topical anesthetic will be used to make this as comfortable as possible, though you may want to take your pain medication prior to the visit.  How often this will need to be done varies from patient to patient.  After complete recovery from the surgery, you may need follow-up endoscopy from time to time, particularly if there is concern of recurrent infection or nasal polyps.  

## 2022-05-07 ENCOUNTER — Ambulatory Visit (AMBULATORY_SURGERY_CENTER): Payer: Medicare HMO | Admitting: Anesthesiology

## 2022-05-07 ENCOUNTER — Encounter: Payer: Self-pay | Admitting: Otolaryngology

## 2022-05-07 ENCOUNTER — Encounter
Admission: RE | Disposition: A | Discharge status: Home / Self Care | Payer: Self-pay | Source: Home / Self Care | Attending: Otolaryngology

## 2022-05-07 ENCOUNTER — Ambulatory Visit: Payer: Medicare HMO | Admitting: Anesthesiology

## 2022-05-07 ENCOUNTER — Other Ambulatory Visit: Payer: Self-pay

## 2022-05-07 ENCOUNTER — Ambulatory Visit
Admission: RE | Admit: 2022-05-07 | Discharge: 2022-05-07 | Disposition: A | Discharge status: Home / Self Care | Payer: Medicare HMO | Attending: Otolaryngology | Admitting: Otolaryngology

## 2022-05-07 DIAGNOSIS — J329 Chronic sinusitis, unspecified: Secondary | ICD-10-CM

## 2022-05-07 DIAGNOSIS — Z9989 Dependence on other enabling machines and devices: Secondary | ICD-10-CM

## 2022-05-07 DIAGNOSIS — Z905 Acquired absence of kidney: Secondary | ICD-10-CM | POA: Diagnosis not present

## 2022-05-07 DIAGNOSIS — K219 Gastro-esophageal reflux disease without esophagitis: Secondary | ICD-10-CM | POA: Diagnosis not present

## 2022-05-07 DIAGNOSIS — Z85528 Personal history of other malignant neoplasm of kidney: Secondary | ICD-10-CM | POA: Insufficient documentation

## 2022-05-07 DIAGNOSIS — J338 Other polyp of sinus: Secondary | ICD-10-CM | POA: Diagnosis not present

## 2022-05-07 DIAGNOSIS — I10 Essential (primary) hypertension: Secondary | ICD-10-CM | POA: Insufficient documentation

## 2022-05-07 DIAGNOSIS — G4733 Obstructive sleep apnea (adult) (pediatric): Secondary | ICD-10-CM

## 2022-05-07 DIAGNOSIS — Z85038 Personal history of other malignant neoplasm of large intestine: Secondary | ICD-10-CM | POA: Diagnosis not present

## 2022-05-07 DIAGNOSIS — Z8546 Personal history of malignant neoplasm of prostate: Secondary | ICD-10-CM | POA: Insufficient documentation

## 2022-05-07 HISTORY — DX: Failed or difficult intubation, initial encounter: T88.4XXA

## 2022-05-07 HISTORY — PX: POLYPECTOMY: SHX149

## 2022-05-07 HISTORY — PX: TURBINATE REDUCTION: SHX6157

## 2022-05-07 HISTORY — PX: IMAGE GUIDED SINUS SURGERY: SHX6570

## 2022-05-07 LAB — GLUCOSE, CAPILLARY: Glucose-Capillary: 121 mg/dL — ABNORMAL HIGH (ref 70–99)

## 2022-05-07 SURGERY — SINUS SURGERY, WITH IMAGING GUIDANCE
Anesthesia: General | Laterality: Right

## 2022-05-07 MED ORDER — SUCCINYLCHOLINE CHLORIDE 200 MG/10ML IV SOSY
PREFILLED_SYRINGE | INTRAVENOUS | Status: DC | PRN
  Administered 2022-05-07: 100 mg via INTRAVENOUS

## 2022-05-07 MED ORDER — LIDOCAINE-EPINEPHRINE 1 %-1:100000 IJ SOLN
INTRAMUSCULAR | Status: DC | PRN
  Administered 2022-05-07: 7 mL

## 2022-05-07 MED ORDER — LACTATED RINGERS IV SOLN: INTRAVENOUS | Status: DC

## 2022-05-07 MED ORDER — DEXAMETHASONE SODIUM PHOSPHATE 4 MG/ML IJ SOLN
INTRAMUSCULAR | Status: DC | PRN
  Administered 2022-05-07: 10 mg via INTRAVENOUS

## 2022-05-07 MED ORDER — OXYMETAZOLINE HCL 0.05 % NA SOLN
NASAL | Status: DC | PRN
  Administered 2022-05-07: 1

## 2022-05-07 MED ORDER — ONDANSETRON HCL 4 MG/2ML IJ SOLN
INTRAMUSCULAR | Status: DC | PRN
  Administered 2022-05-07: 4 mg via INTRAVENOUS

## 2022-05-07 MED ORDER — PROMETHAZINE HCL 25 MG/ML IJ SOLN: 6.2500 mg | INTRAMUSCULAR | Status: DC | PRN

## 2022-05-07 MED ORDER — AMOXICILLIN 875 MG PO TABS: 875.0000 mg | ORAL_TABLET | Freq: Two times a day (BID) | ORAL | 0 refills | Status: AC

## 2022-05-07 MED ORDER — HYDROMORPHONE HCL 1 MG/ML IJ SOLN: 0.2500 mg | INTRAMUSCULAR | Status: DC | PRN

## 2022-05-07 MED ORDER — ONDANSETRON HCL 4 MG PO TABS: 4.0000 mg | ORAL_TABLET | Freq: Three times a day (TID) | ORAL | 0 refills | Status: AC | PRN

## 2022-05-07 MED ORDER — HYDROCODONE-ACETAMINOPHEN 5-325 MG PO TABS: 1.0000 | ORAL_TABLET | ORAL | 0 refills | Status: AC | PRN

## 2022-05-07 MED ORDER — MIDAZOLAM HCL 5 MG/5ML IJ SOLN
INTRAMUSCULAR | Status: DC | PRN
  Administered 2022-05-07: 2 mg via INTRAVENOUS

## 2022-05-07 MED ORDER — EPHEDRINE SULFATE (PRESSORS) 50 MG/ML IJ SOLN
INTRAMUSCULAR | Status: DC | PRN
  Administered 2022-05-07: 10 mg via INTRAVENOUS
  Administered 2022-05-07: 5 mg via INTRAVENOUS

## 2022-05-07 MED ORDER — OXYCODONE HCL 5 MG PO TABS: 5.0000 mg | ORAL_TABLET | Freq: Once | ORAL | Status: DC | PRN

## 2022-05-07 MED ORDER — OXYCODONE HCL 5 MG/5ML PO SOLN: 5.0000 mg | Freq: Once | ORAL | Status: DC | PRN

## 2022-05-07 MED ORDER — GLYCOPYRROLATE 0.2 MG/ML IJ SOLN
INTRAMUSCULAR | Status: DC | PRN
  Administered 2022-05-07: .4 mg via INTRAVENOUS

## 2022-05-07 MED ORDER — LIDOCAINE HCL (CARDIAC) PF 100 MG/5ML IV SOSY
PREFILLED_SYRINGE | INTRAVENOUS | Status: DC | PRN
  Administered 2022-05-07: 100 mg via INTRAVENOUS

## 2022-05-07 MED ORDER — FENTANYL CITRATE (PF) 100 MCG/2ML IJ SOLN
INTRAMUSCULAR | Status: DC | PRN
  Administered 2022-05-07: 100 ug via INTRAVENOUS

## 2022-05-07 MED ORDER — PROPOFOL 10 MG/ML IV BOLUS
INTRAVENOUS | Status: DC | PRN
  Administered 2022-05-07: 70 mg via INTRAVENOUS
  Administered 2022-05-07: 160 mg via INTRAVENOUS
  Administered 2022-05-07: 40 mg via INTRAVENOUS

## 2022-05-07 SURGICAL SUPPLY — 33 items
BLADE SHAVER TRUDI 4 15 DEG (ENT DISPOSABLE) IMPLANT
BLADE SHAVER TRUDI 4 40 DEG (ENT DISPOSABLE) IMPLANT
BLADE SHAVER TRUDI STR 4 (ENT DISPOSABLE) IMPLANT
CABLE TRUDI DISPOSABLE (ENT DISPOSABLE) ×4 IMPLANT
CANISTER SUCT 1200ML W/VALVE (MISCELLANEOUS) ×2 IMPLANT
COAG SUCT 10F 3.5MM HAND CTRL (MISCELLANEOUS) IMPLANT
COAGULATOR SUCT 8FR VV (MISCELLANEOUS) IMPLANT
DEVICE INFLATION SEID (MISCELLANEOUS) IMPLANT
DRESSING NASL FOAM PST OP SINU (MISCELLANEOUS) IMPLANT
DRSG NASAL FOAM POST OP SINU (MISCELLANEOUS) ×2
ELECT REM PT RETURN 9FT ADLT (ELECTROSURGICAL) ×2
ELECTRODE REM PT RTRN 9FT ADLT (ELECTROSURGICAL) ×2 IMPLANT
GLOVE SURG GAMMEX PI TX LF 7.5 (GLOVE) ×4 IMPLANT
GOWN STRL REUS W/ TWL LRG LVL3 (GOWN DISPOSABLE) ×2 IMPLANT
GOWN STRL REUS W/TWL LRG LVL3 (GOWN DISPOSABLE) ×2
IV NS 500ML (IV SOLUTION) ×2
IV NS 500ML BAXH (IV SOLUTION) ×2 IMPLANT
KIT TURNOVER KIT A (KITS) ×2 IMPLANT
NS IRRIG 500ML POUR BTL (IV SOLUTION) ×2 IMPLANT
PACK ENT CUSTOM (PACKS) ×2 IMPLANT
PACKING NASAL EPIS 4X2.4 XEROG (MISCELLANEOUS) IMPLANT
PATTIES SURGICAL .5 X3 (DISPOSABLE) ×2 IMPLANT
SOL ANTI-FOG 6CC FOG-OUT (MISCELLANEOUS) ×2 IMPLANT
SOL FOG-OUT ANTI-FOG 6CC (MISCELLANEOUS) ×2
STRAP BODY AND KNEE 60X3 (MISCELLANEOUS) ×2 IMPLANT
SYR 10ML LL (SYRINGE) ×2 IMPLANT
SYR EAR/ULCER 2OZ (SYRINGE) ×2 IMPLANT
SYSTEM BALLOON SINUPLASTY 6X16 (SINUPLASTY) IMPLANT
TRACKER DISPOSABLE PAITIENT (MISCELLANEOUS) ×2 IMPLANT
TRAP ETRAP POLY (MISCELLANEOUS) IMPLANT
TUBING CONNECTING 10 (TUBING) IMPLANT
TUBING IRRIGATION BIEN-AIR (TUBING) ×2 IMPLANT
WATER STERILE IRR 250ML POUR (IV SOLUTION) IMPLANT

## 2022-05-07 NOTE — Transfer of Care (Signed)
Immediate Anesthesia Transfer of Care Note  Patient: Scott Hayden  Procedure(s) Performed: IMAGE GUIDED SINUS SURGERY POLYPECTOMY NASAL (Right) INFERIOR TURBINATE REDUCTION (Right)  Patient Location: PACU  Anesthesia Type: General  Level of Consciousness: awake, alert  and patient cooperative  Airway and Oxygen Therapy: Patient Spontanous Breathing and Patient connected to supplemental oxygen  Post-op Assessment: Post-op Vital signs reviewed, Patient's Cardiovascular Status Stable, Respiratory Function Stable, Patent Airway and No signs of Nausea or vomiting  Post-op Vital Signs: Reviewed and stable  Complications: No notable events documented.

## 2022-05-07 NOTE — Anesthesia Procedure Notes (Addendum)
Procedure Name: Intubation Date/Time: 05/07/2022 9:24 AM  Performed by: Tobie Poet, CRNAPre-anesthesia Checklist: Patient identified, Emergency Drugs available, Suction available and Patient being monitored Patient Re-evaluated:Patient Re-evaluated prior to induction Oxygen Delivery Method: Circle system utilized Preoxygenation: Pre-oxygenation with 100% oxygen Induction Type: IV induction Ventilation: Mask ventilation with difficulty, Two handed mask ventilation required and Oral airway inserted - appropriate to patient size Laryngoscope Size: Glidescope and 3 Grade View: Grade II Tube type: Oral Tube size: 8.0 mm Number of attempts: 1 Airway Equipment and Method: Stylet and Oral airway Placement Confirmation: ETT inserted through vocal cords under direct vision, positive ETCO2 and breath sounds checked- equal and bilateral Secured at: 20 cm Tube secured with: Tape Dental Injury: Teeth and Oropharynx as per pre-operative assessment  Comments: First attempt with glidecope unsuccessful (view of cords and epiglottis obtained, false cords prominent but true cords not well visualized, redundant/hypertrophic tissue at base of tongue behind epiglottis and redundant tissue in the periglottic area contributing to diminished visualization, tube passed through cords and stylet removed under visualization but no breath sounds, gastric rumble present). Second attempt with glide + bougie successful. 2 handed Mask with OPA in between attempts.

## 2022-05-07 NOTE — H&P (Signed)
..  History and Physical paper copy reviewed and updated date of procedure and will be scanned into system.  Patient seen and examined and marked.  

## 2022-05-07 NOTE — Op Note (Signed)
..  05/07/2022  9:42 AM    Scott Hayden  [id number]   Pre-Op Dx:  Chronic Rhinosinusitis refractory to medical treatment  Post-op Dx: Same  Proc:   1)  Nasal polypectomy  2)  Right inferior turbinate reduction  3)  Image Guided Sinus surgery  Surg:  [name] [name]  Anes:  General  EBL:  27m  Comp:  None  Findings: Polypoid mass emanating from mid to posterior inferior turbinate and extending back into patient's choana.  All polypoid appearing mass removed.  Procedure: After the patient was identified in holding and the benefits of the procedure were reviewed as well as the consent and risks.  The patient was taken to the operating room and with the patient in a comfortable supine position,  general orotracheal anesthesia was induced without difficulty.  A proper time-out was performed.  The Acclarent image guided system was set up in the normal fashion.  The patient next received preoperative Afrin spray for topical decongestion and vasoconstriction and 1% Xylocaine with 1:100,000 epinephrine, 7 cc's, was infiltrated into the inferior turbinates, septum, and anterior middle turbinates on the patient's right side.  Several minutes were allowed for this to take effect.  Cottoniod pledgets soaked in Afrin were placed into both nasal cavities and left while the patient was prepped and draped in the standard fashion.  The materials were removed from the nose and observed to be intact and correct in number.  The nose was next inspected with a zero degree endoscope.  This demonstrated an accessory ostia of the right maxillary sinus but otherwise normal osteo-meatal complex.  The inferior turbinate was noted to have a polypoid mass emanating from the mid to posterior aspect and was broad based.  At this time, a kelly clamp was placed along the inferior turbinate on the right side just superior to the mass.  Using a Grimwald forcep, the mass in the mid septum was then seperated from the  inferior turbinate.  This was sent for permanent pathological evaluation.  The remaining mass was removed with micro-debrider.  Evaluation of the inferior turbinate did not reveal any residual mass and the cut edges of the turbinate were cauterized.  A small posterior aspect of the inferior turbinate was left to help with airflow and humidification.  Hemostasis was continued.  Stamberger sinufoam was placed along the cut edge of the inferior turbinate.  Care of the patient at this time was transferred to anesthesia and was extubated and taken to PACU in good condition.   Dispo:   PACU to home  Plan: Ice, elevation, narcotic analgesia and prophylactic antibiotics.   We will reevaluate the patient in the office in 7 days.  Return to work in 7-10 days, strenuous activities in two weeks.   CJeannie FendVaught 05/07/2022 9:42 AM

## 2022-05-07 NOTE — Anesthesia Postprocedure Evaluation (Signed)
Anesthesia Post Note  Patient: Scott Hayden  Procedure(s) Performed: IMAGE GUIDED SINUS SURGERY POLYPECTOMY NASAL (Right) INFERIOR TURBINATE REDUCTION (Right)     Patient location during evaluation: PACU Anesthesia Type: General Level of consciousness: awake and alert Pain management: pain level controlled Vital Signs Assessment: post-procedure vital signs reviewed and stable Respiratory status: spontaneous breathing, nonlabored ventilation, respiratory function stable and patient connected to nasal cannula oxygen Cardiovascular status: blood pressure returned to baseline and stable Postop Assessment: no apparent nausea or vomiting Anesthetic complications: no   No notable events documented.  [name]

## 2022-05-07 NOTE — Anesthesia Preprocedure Evaluation (Signed)
Anesthesia Evaluation  Patient identified by MRN, date of birth, ID band Patient awake    Reviewed: Allergy & Precautions, H&P , NPO status , Patient's Chart, lab work & pertinent test results, reviewed documented beta blocker date and time   Airway Mallampati: III  TM Distance: >3 FB Neck ROM: full    Dental no notable dental hx.    Pulmonary neg pulmonary ROS, sleep apnea and Continuous Positive Airway Pressure Ventilation ,    Pulmonary exam normal breath sounds clear to auscultation       Cardiovascular Exercise Tolerance: Good hypertension, + Valvular Problems/Murmurs (childhood murmur)  Rhythm:regular Rate:Normal     Neuro/Psych negative neurological ROS  negative psych ROS   GI/Hepatic negative GI ROS, Neg liver ROS, GERD  ,Colon ca   Endo/Other  negative endocrine ROS  Renal/GU Renal diseasenegative Renal ROSRenal ca s/p partial nephrectomy   BPH, prostate ca negative genitourinary   Musculoskeletal   Abdominal   Peds  Hematology negative hematology ROS (+)   Anesthesia Other Findings   Reproductive/Obstetrics negative OB ROS                            Anesthesia Physical Anesthesia Plan  ASA: 3  Anesthesia Plan: General   Post-op Pain Management:    Induction:   PONV Risk Score and Plan: 2 and Dexamethasone, Ondansetron and Treatment may vary due to age or medical condition  Airway Management Planned:   Additional Equipment:   Intra-op Plan:   Post-operative Plan:   Informed Consent: I have reviewed the patients History and Physical, chart, labs and discussed the procedure including the risks, benefits and alternatives for the proposed anesthesia with the patient or authorized representative who has indicated his/her understanding and acceptance.     Dental Advisory Given  Plan Discussed with: CRNA  Anesthesia Plan Comments:         Anesthesia Quick  Evaluation

## 2022-05-08 ENCOUNTER — Encounter: Payer: Self-pay | Admitting: Certified Registered Nurse Anesthetist

## 2022-05-09 ENCOUNTER — Encounter: Payer: Self-pay | Admitting: Otolaryngology

## 2022-05-09 LAB — SURGICAL PATHOLOGY

## 2022-05-16 ENCOUNTER — Encounter: Payer: Self-pay | Admitting: Gastroenterology

## 2022-05-16 ENCOUNTER — Ambulatory Visit: Payer: Medicare HMO | Admitting: Gastroenterology

## 2022-05-16 VITALS — BP 108/68 | HR 74 | Temp 98.0°F | Ht 72.0 in | Wt 259.0 lb

## 2022-05-16 MED ORDER — FLEET ENEMA 7-19 GM/118ML RE ENEM
1.0000 | ENEMA | RECTAL | Status: AC
  Administered 2022-05-16: 1 via RECTAL

## 2022-05-16 MED ORDER — NA SULFATE-K SULFATE-MG SULF 17.5-3.13-1.6 GM/177ML PO SOLN: 1.0000 | Freq: Once | ORAL | 0 refills | Status: AC

## 2022-05-16 MED ORDER — SODIUM CHLORIDE 0.9 % IV SOLN: 500.0000 mL | INTRAVENOUS | Status: DC

## 2022-05-16 NOTE — Progress Notes (Unsigned)
On arrival patient reports having brown stool. FLEET enema x1 administered, stool still brown.  [facility] notified , patient rescheduled.

## 2022-05-16 NOTE — Progress Notes (Unsigned)
Pt rescheduled for Tuesday 05/20/22 @ 4:00 pm.  Suprep instructions reviewed with patient and wife.

## 2022-05-20 ENCOUNTER — Ambulatory Visit (AMBULATORY_SURGERY_CENTER): Payer: Medicare HMO | Admitting: Gastroenterology

## 2022-05-20 ENCOUNTER — Encounter: Payer: Self-pay | Admitting: Gastroenterology

## 2022-05-20 VITALS — BP 132/84 | HR 65 | Temp 98.4°F | Resp 14 | Ht 72.0 in | Wt 259.0 lb

## 2022-05-20 DIAGNOSIS — K5900 Constipation, unspecified: Secondary | ICD-10-CM

## 2022-05-20 DIAGNOSIS — K635 Polyp of colon: Secondary | ICD-10-CM

## 2022-05-20 DIAGNOSIS — R194 Change in bowel habit: Secondary | ICD-10-CM

## 2022-05-20 DIAGNOSIS — Z85038 Personal history of other malignant neoplasm of large intestine: Secondary | ICD-10-CM

## 2022-05-20 DIAGNOSIS — D125 Benign neoplasm of sigmoid colon: Secondary | ICD-10-CM

## 2022-05-20 DIAGNOSIS — K5730 Diverticulosis of large intestine without perforation or abscess without bleeding: Secondary | ICD-10-CM

## 2022-05-20 MED ORDER — SODIUM CHLORIDE 0.9 % IV SOLN: 500.0000 mL | Freq: Once | INTRAVENOUS | Status: DC

## 2022-05-20 NOTE — Progress Notes (Signed)
GASTROENTEROLOGY PROCEDURE H&P NOTE   Primary Care Physician: Deland Pretty, MD    Reason for Procedure:  History of colon cancer, constipation, change in bowel habits   Plan:    Colonoscopy  Patient is appropriate for endoscopic procedure(s) in the ambulatory ([facility]) setting.  The nature of the procedure, as well as the risks, benefits, and alternatives were carefully and thoroughly reviewed with the patient. Ample time for discussion and questions allowed. The patient understood, was satisfied, and agreed to proceed.     HPI: Scott Hayden is a 71 y.o. male who presents for colonoscopy for evaluation of constipation and change in bowel habits.  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  No evidence of recurrence on most recent imaging and colonoscopy in 2020.  Recent Sitzmarks study with 14 retained markers with the majority of the left colon and rectum indicating a positive study.  Scheduled for ARM.  Started on MiraLAX.  Presents for colonoscopy today.  Past Medical History:  Diagnosis Date   Allergy    BPH (benign prostatic hypertrophy)    Cancer (HCC)    colon cancer 2010, kidney cancer 2010   Chemotherapy-induced neuropathy (HCC)    OXALIPLATINUM CHEMO DRUG--  BILATERAL DISTAL UPPER AND LOWER EXTREMITIES   Colon cancer ([place])    Difficult intubation    Elevated PSA    GERD (gastroesophageal reflux disease)    Glaucoma 06/09/2012   Heart murmur    as a child    History of colon cancer 05/31/2012   History of colon cancer, stage III ONCOLOGIST--  DR Allayne Gitelman--  NO RECURRENCE   SIGMOID COLON CA  STAGE IIIA ,  T2, N1--  S/P COLONECTOMY AND CHEMOTHERAPY   History of colon polyps    History of gastric polyp    History of kidney cancer 05/31/2012   History of prostate cancer 01/27/2014   History of renal cell carcinoma 05/31/2012   STAGE I  GRADE 2  FUHRMAN--  S/P PARTIAL LEFT NEPHRECOTMY WITH  NEGATIVE MARGINS   Hyperlipidemia    Hypertension    Neuromuscular disorder ([place])    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    OSA on CPAP    cpap settings- 10    Pneumonia    hx of    Prostate cancer ([place]) 08/18/2013   s/p prostatectomy   Pulmonary nodule    RIGHT MIDDLE LOBE PER CT 10-26-2013  STABLE   Sleep apnea    cpap   Tubular adenoma    Wears glasses    Wears hearing aid    BILATERAL    Past Surgical History:  Procedure Laterality Date   CATARACT EXTRACTION W/ INTRAOCULAR LENS  IMPLANT, BILATERAL  04/2011 & 12/2011   COLONOSCOPY  07/05/2014   ESOPHAGOGASTRODUODENOSCOPY  10/13/2011   IMAGE GUIDED SINUS SURGERY N/A 05/07/2022   Procedure: IMAGE GUIDED SINUS SURGERY;  Surgeon: Carloyn Manner, MD;  Location: [facility];  Service: ENT;  Laterality: N/A;  NEED STRYKER DISK gave disk to Manuela Schwartz 02/03/22   INGUINAL HERNIA REPAIR Left 01/16/1977   KNEE ARTHROSCOPY Left 09/19/2007   LAPAROSCOPIC SIGMOID COLECTOMY  03/09/2009  ([place])   LYMPHADENECTOMY Bilateral 01/27/2014   Procedure: BILATERAL PELVIC LYMPH NODE DISSECTION;  Surgeon: Alexis Frock, MD;  Location: [facility];  Service: Urology;  Laterality: Bilateral;   POLYPECTOMY Right 05/07/2022   Procedure: POLYPECTOMY NASAL;  Surgeon: Carloyn Manner, MD;  Location: [facility];  Service: ENT;  Laterality: Right;   PROSTATE BIOPSY N/A 11/30/2013   Procedure: SATURATION BIOPSY TRANSRECTAL ULTRASONIC PROSTATE (TUBP);  Surgeon: Alexis Frock, MD;  Location: [facility];  Service: Urology;  Laterality: N/A;   ROBOT ASSISTED LAPAROSCOPIC RADICAL PROSTATECTOMY N/A 01/27/2014   Procedure: ROBOTIC ASSISTED LAPAROSCOPIC RADICAL PROSTATECTOMY WITH INDOCYANINE GREEN DYE,EXTENSIVE ADHESIOLYSIS " PRE -PERITONEAL";  Surgeon: Alexis Frock, MD;  Location: [facility];  Service: Urology;  Laterality: N/A;   ROBOTIC ASSITED PARTIAL NEPHRECTOMY Left 03/21/2012   TURBINATE REDUCTION Right 05/07/2022   Procedure:  INFERIOR TURBINATE REDUCTION;  Surgeon: Carloyn Manner, MD;  Location: [facility];  Service: ENT;  Laterality: Right;  sleep apnea    Prior to Admission medications   Medication Sig Start Date End Date Taking? Authorizing Provider  amLODipine (NORVASC) 5 MG tablet Take 5 mg by mouth daily. 02/24/22  Yes [provider]  esomeprazole (NEXIUM) 40 MG capsule Take 40 mg by mouth every evening.    Yes [provider]  latanoprost (XALATAN) 0.005 % ophthalmic solution Place 1 drop into both eyes at bedtime.    Yes [provider]  metoprolol succinate (TOPROL-XL) 100 MG 24 hr tablet Take 100 mg by mouth every morning. Take with or immediately following a meal.   Yes [provider]  oxybutynin (DITROPAN) 5 MG tablet Take 15 mg by mouth daily.   Yes [provider]  rosuvastatin (CRESTOR) 10 MG tablet Take 10 mg by mouth daily.   Yes [provider]  aspirin 81 MG tablet Take 81 mg by mouth daily.    [provider]  calcium carbonate (OS-CAL) 600 MG TABS tablet Take 600 mg by mouth daily with breakfast.    [provider]  cetirizine (ZYRTEC) 10 MG tablet Take 10 mg by mouth daily as needed for allergies. Patient not taking: Reported on 05/01/2022    [provider]  HYDROcodone-acetaminophen (NORCO) 5-325 MG tablet Take 1 tablet by mouth every 4 (four) hours as needed for moderate pain. 05/07/22   Carloyn Manner, MD  Multiple Vitamin (MULTIVITAMIN) tablet Take 1 tablet by mouth daily.    [provider]  ondansetron (ZOFRAN) 4 MG tablet Take 1 tablet (4 mg total) by mouth every 8 (eight) hours as needed for nausea or vomiting. 05/07/22   Vaught, [name], MD  polyethylene glycol powder (MIRALAX) 17 GM/SCOOP powder See admin instructions. Every other day    [provider]    Current Outpatient Medications  Medication Sig Dispense Refill   amLODipine (NORVASC) 5 MG tablet Take 5 mg by mouth  daily.     esomeprazole (NEXIUM) 40 MG capsule Take 40 mg by mouth every evening.      latanoprost (XALATAN) 0.005 % ophthalmic solution Place 1 drop into both eyes at bedtime.      metoprolol succinate (TOPROL-XL) 100 MG 24 hr tablet Take 100 mg by mouth every morning. Take with or immediately following a meal.     oxybutynin (DITROPAN) 5 MG tablet Take 15 mg by mouth daily.     rosuvastatin (CRESTOR) 10 MG tablet Take 10 mg by mouth daily.     aspirin 81 MG tablet Take 81 mg by mouth daily.     calcium carbonate (OS-CAL) 600 MG TABS tablet Take 600 mg by mouth daily with breakfast.     cetirizine (ZYRTEC) 10 MG tablet Take 10 mg by mouth daily as needed for allergies. (Patient not taking: Reported on 05/01/2022)     HYDROcodone-acetaminophen (  NORCO) 5-325 MG tablet Take 1 tablet by mouth every 4 (four) hours as needed for moderate pain. 10 tablet 0   Multiple Vitamin (MULTIVITAMIN) tablet Take 1 tablet by mouth daily.     ondansetron (ZOFRAN) 4 MG tablet Take 1 tablet (4 mg total) by mouth every 8 (eight) hours as needed for nausea or vomiting. 20 tablet 0   polyethylene glycol powder (MIRALAX) 17 GM/SCOOP powder See admin instructions. Every other day     Current Facility-Administered Medications  Medication Dose Route Frequency Provider Last Rate Last Admin   0.9 %  sodium chloride infusion  500 mL Intravenous Continuous [place] Grosser V, DO       0.9 %  sodium chloride infusion  500 mL Intravenous Once Emanuele Mcwhirter V, DO        Allergies as of 05/20/2022 - Review Complete 05/20/2022  Allergen Reaction Noted   Shellfish allergy Swelling 06/09/2012   Contrast media [iodinated contrast media] Rash 06/16/2012   Ultravist [iopromide] Rash 06/09/2012    Family History  Problem Relation Age of Onset   Stomach cancer Mother 58   Hodgkin's lymphoma Father    Prostate cancer Father    Colon cancer Neg Hx    Rectal cancer Neg Hx    Esophageal cancer Neg Hx     Social History    Socioeconomic History   Marital status: Married    Spouse name: Thayer Headings   Number of children: 2   Years of education: MS+   Highest education level: Not on file  Occupational History   Occupation: Retired  Tobacco Use   Smoking status: Never   Smokeless tobacco: Never  Vaping Use   Vaping Use: Never used  Substance and Sexual Activity   Alcohol use: Yes    Alcohol/week: 2.0 standard drinks of alcohol    Types: 2 Cans of beer per week    Comment: RARE   Drug use: No   Sexual activity: Not on file  Other Topics Concern   Not on file  Social History Narrative   Pt lives at home with spouse.    Caffeine Use: 2-3 cups daily.    Social Determinants of Health   Financial Resource Strain: Not on file  Food Insecurity: Not on file  Transportation Needs: Not on file  Physical Activity: Not on file  Stress: Not on file  Social Connections: Not on file  Intimate Partner Violence: Not on file    Physical Exam: Vital signs in last 24 hours: '@BP'$  111/69   Pulse 72   Temp 98.4 F (36.9 C)   Ht 6' (1.829 m)   Wt 259 lb (117.5 kg)   SpO2 94%   BMI 35.13 kg/m  GEN: NAD EYE: Sclerae anicteric ENT: MMM CV: Non-tachycardic Pulm: CTA b/l GI: Soft, NT/ND NEURO:  Alert & Oriented x 3   Gerrit Heck, DO [facility]   05/20/2022 3:53 PM

## 2022-05-20 NOTE — Patient Instructions (Signed)
   Handouts on polyps & diverticulosis given to you today  Await pathology results on polyp removed   Take Miralax 1 capful daily, titrate up or down to achieve softer stools without straining    Dr Bryan Lemma will follow up results of anorectal manometry (already scheduled)   YOU HAD AN ENDOSCOPIC PROCEDURE TODAY AT [facility]:   Refer to the procedure report that was given to you for any specific questions about what was found during the examination.  If the procedure report does not answer your questions, please call your gastroenterologist to clarify.  If you requested that your care partner not be given the details of your procedure findings, then the procedure report has been included in a sealed envelope for you to review at your convenience later.  YOU SHOULD EXPECT: Some feelings of bloating in the abdomen. Passage of more gas than usual.  Walking can help get rid of the air that was put into your GI tract during the procedure and reduce the bloating. If you had a lower endoscopy (such as a colonoscopy or flexible sigmoidoscopy) you may notice spotting of blood in your stool or on the toilet paper. If you underwent a bowel prep for your procedure, you may not have a normal bowel movement for a few days.  Please Note:  You might notice some irritation and congestion in your nose or some drainage.  This is from the oxygen used during your procedure.  There is no need for concern and it should clear up in a day or so.  SYMPTOMS TO REPORT IMMEDIATELY:  Following lower endoscopy (colonoscopy or flexible sigmoidoscopy):  Excessive amounts of blood in the stool  Significant tenderness or worsening of abdominal pains  Swelling of the abdomen that is new, acute  Fever of 100F or higher  For urgent or emergent issues, a gastroenterologist can be reached at any hour by calling [phone]. Do not use MyChart messaging for urgent concerns.    DIET:  We do recommend a  small meal at first, but then you may proceed to your regular diet.  Drink plenty of fluids but you should avoid alcoholic beverages for 24 hours.  ACTIVITY:  You should plan to take it easy for the rest of today and you should NOT DRIVE or use heavy machinery until tomorrow (because of the sedation medicines used during the test).    FOLLOW UP: Our staff will call the number listed on your records the next business day following your procedure.  We will call around 7:15- 8:00 am to check on you and address any questions or concerns that you may have regarding the information given to you following your procedure. If we do not reach you, we will leave a message.     If any biopsies were taken you will be contacted by phone or by letter within the next 1-3 weeks.  Please call us at [phone] if you have not heard about the biopsies in 3 weeks.    SIGNATURES/CONFIDENTIALITY: You and/or your care partner have signed paperwork which will be entered into your electronic medical record.  These signatures attest to the fact that that the information above on your After Visit Summary has been reviewed and is understood.  Full responsibility of the confidentiality of this discharge information lies with you and/or your care-partner.

## 2022-05-20 NOTE — Progress Notes (Signed)
Pt's states no medical or surgical changes since previsit or office visit. 

## 2022-05-20 NOTE — Op Note (Signed)
Scott Hayden: Scott Hayden Procedure Date: 05/20/2022 3:55 PM MRN: [id number] Endoscopist: Gerrit Heck , MD Age: 71 Referring MD:  Date of Birth: 1950-08-20 Gender: Male Account #: [id number] Procedure:                Colonoscopy Indications:              Change in bowel habits, Constipation, Hx of colon                            cancer.                           71 yo male with a history of sigmoid adenocarcinoma                            s/p sigmoid colectomy in 2010 followed by                            chemotherapyc/b neuropathy. Presents for                            colonsocopy today for ongoing surveillance and                            evaluation of constipation and change in bowel                            habits. No e/o recurrence on recent imaging or last                            colonoscopy in 2020. Recent Sitzmarks study with                            14 retained markers with the majority of the left                            colon and rectum indicating a positive study.                            Scheduled for ARM. Started on MiraLAX. Presents for                            colonoscopy today. Medicines:                Monitored Anesthesia Care Procedure:                Pre-Anesthesia Assessment:                           - Prior to the procedure, a History and Physical                            was performed, and patient medications and  allergies were reviewed. The patient's tolerance of                            previous anesthesia was also reviewed. The risks                            and benefits of the procedure and the sedation                            options and risks were discussed with the patient.                            All questions were answered, and informed consent                            was obtained. Prior Anticoagulants: The patient has                            taken no  previous anticoagulant or antiplatelet                            agents. ASA Grade Assessment: II - A patient with                            mild systemic disease. After reviewing the risks                            and benefits, the patient was deemed in                            satisfactory condition to undergo the procedure.                           After obtaining informed consent, the colonoscope                            was passed under direct vision. Throughout the                            procedure, the patient's blood pressure, pulse, and                            oxygen saturations were monitored continuously. The                            Olympus CF-HQ190L [id number]) Colonoscope was                            introduced through the anus and advanced to the the                            cecum, identified by appendiceal orifice and  ileocecal valve. The colonoscopy was performed                            without difficulty. The patient tolerated the                            procedure well. The quality of the bowel                            preparation was good. The ileocecal valve,                            appendiceal orifice, and rectum were photographed. Scope In: 4:03:42 PM Scope Out: 4:20:34 PM Scope Withdrawal Time: 0 hours 11 minutes 57 seconds  Total Procedure Duration: 0 hours 16 minutes 52 seconds  Findings:                 The perianal and digital rectal examinations were                            normal.                           A 3 mm polyp was found in the sigmoid colon. The                            polyp was sessile. The polyp was removed with a                            cold snare. Resection and retrieval were complete.                            Estimated blood loss was minimal.                           A few small-mouthed diverticula were found in the                            sigmoid colon.                            There was evidence of a prior end-to-end                            colo-colonic anastomosis in the sigmoid colon                            located 20 cm from the anal verge. This was patent                            and was characterized by healthy appearing mucosa.                            The anastomosis was traversed.  The retroflexed view of the distal rectum and anal                            verge was normal and showed no anal or rectal                            abnormalities. Complications:            No immediate complications. Estimated Blood Loss:     Estimated blood loss was minimal. Impression:               - One 3 mm polyp in the sigmoid colon, removed with                            a cold snare. Resected and retrieved.                           - Diverticulosis in the sigmoid colon.                           - Patent end-to-end colo-colonic anastomosis,                            characterized by healthy appearing mucosa.                           - Otherwise, normal appearing colon throughout. No                            areas of stricture or luminal narrowing, and no                            mucosal erythema, edema, erosions, or ulceration.                           - The distal rectum and anal verge are normal on                            retroflexion view. Recommendation:           - Patient has a contact number available for                            emergencies. The signs and symptoms of potential                            delayed complications were discussed with the                            patient. Return to normal activities tomorrow.                            Written discharge instructions were provided to the  patient.                           - Resume previous diet.                           - Continue present medications.                           - Miralax 1 cap daily and titrate up  or down to                            achieve soft stools without straining to have bowel                            movement.                           - Will follow-up on results of Anorectal Manometry                            test that is already scheduled.                           - Await pathology results.                           - Repeat colonoscopy in 3 - 5 years for                            surveillance based on pathology results.                           - Return to GI office PRN. Gerrit Heck, MD 05/20/2022 4:29:03 PM

## 2022-05-20 NOTE — Progress Notes (Signed)
Sedate, gd SR, tolerated procedure well, VSS, report to RN 

## 2022-05-20 NOTE — Progress Notes (Signed)
Called to room to assist during endoscopic procedure.  Patient ID and intended procedure confirmed with present staff. Received instructions for my participation in the procedure from the performing physician.  

## 2022-05-21 ENCOUNTER — Telehealth: Payer: Self-pay

## 2022-05-21 NOTE — Telephone Encounter (Signed)
  Follow up Call-     05/20/2022    3:18 PM  Call back number  Post procedure Call Back phone  # [phone]  Permission to leave phone message Yes     Patient questions:  Do you have a fever, pain , or abdominal swelling? No. Pain Score  0 *  Have you tolerated food without any problems? Yes.    Have you been able to return to your normal activities? Yes.    Do you have any questions about your discharge instructions: Diet   No. Medications  No. Follow up visit  No.  Do you have questions or concerns about your Care? No.  Actions: * If pain score is 4 or above: No action needed, pain <4.

## 2022-05-27 ENCOUNTER — Encounter: Payer: Self-pay | Admitting: Gastroenterology

## 2022-06-27 ENCOUNTER — Other Ambulatory Visit (HOSPITAL_BASED_OUTPATIENT_CLINIC_OR_DEPARTMENT_OTHER): Payer: Self-pay

## 2022-08-06 ENCOUNTER — Ambulatory Visit (HOSPITAL_COMMUNITY)
Admission: RE | Admit: 2022-08-06 | Discharge: 2022-08-06 | Disposition: A | Discharge status: Home / Self Care | Payer: Medicare HMO | Attending: Gastroenterology | Admitting: Gastroenterology

## 2022-08-06 ENCOUNTER — Encounter (HOSPITAL_COMMUNITY): Payer: Self-pay | Admitting: Gastroenterology

## 2022-08-06 ENCOUNTER — Encounter (HOSPITAL_COMMUNITY)
Admission: RE | Disposition: A | Discharge status: Home / Self Care | Payer: Self-pay | Source: Home / Self Care | Attending: Gastroenterology

## 2022-08-06 DIAGNOSIS — K5900 Constipation, unspecified: Secondary | ICD-10-CM | POA: Diagnosis present

## 2022-08-06 DIAGNOSIS — K6289 Other specified diseases of anus and rectum: Secondary | ICD-10-CM | POA: Insufficient documentation

## 2022-08-06 DIAGNOSIS — K5902 Outlet dysfunction constipation: Secondary | ICD-10-CM | POA: Diagnosis not present

## 2022-08-06 HISTORY — PX: ANAL RECTAL MANOMETRY: SHX6358

## 2022-08-06 SURGERY — MANOMETRY, ANORECTAL

## 2022-08-06 NOTE — Progress Notes (Signed)
Anal rectal manometry performed per protocol without complications.  Patient tolerated well.  Balloon expulsion test performed per protocol without complications.  Patient tolerated well.  Unable to expel balloon after 3 minutes.  Balloon removed.

## 2022-08-11 ENCOUNTER — Encounter (HOSPITAL_COMMUNITY): Payer: Self-pay | Admitting: Gastroenterology

## 2022-08-21 ENCOUNTER — Other Ambulatory Visit: Payer: Self-pay

## 2022-08-21 DIAGNOSIS — K5900 Constipation, unspecified: Secondary | ICD-10-CM

## 2022-09-02 ENCOUNTER — Other Ambulatory Visit: Payer: Self-pay

## 2022-09-02 ENCOUNTER — Ambulatory Visit: Payer: Medicare HMO | Attending: Gastroenterology

## 2022-09-02 DIAGNOSIS — M6281 Muscle weakness (generalized): Secondary | ICD-10-CM | POA: Diagnosis present

## 2022-09-02 DIAGNOSIS — R293 Abnormal posture: Secondary | ICD-10-CM | POA: Diagnosis not present

## 2022-09-02 DIAGNOSIS — K5900 Constipation, unspecified: Secondary | ICD-10-CM | POA: Insufficient documentation

## 2022-09-02 DIAGNOSIS — R279 Unspecified lack of coordination: Secondary | ICD-10-CM | POA: Diagnosis present

## 2022-09-02 NOTE — Therapy (Signed)
OUTPATIENT PHYSICAL THERAPY MALE PELVIC EVALUATION   Patient Name: Scott Hayden MRN: [id number] DOB:[birth year], 72 y.o., male Today's Date: 09/02/2022  END OF SESSION:  PT End of Session - 09/02/22 1445     Visit Number 1    Date for PT Re-Evaluation 11/11/22    Authorization Type [employer]    PT Start Time 1445    PT Stop Time 1525    PT Time Calculation (min) 40 min    Activity Tolerance Patient tolerated treatment well    Behavior During Therapy WFL for tasks assessed/performed             Past Medical History:  Diagnosis Date   Allergy    BPH (benign prostatic hypertrophy)    Cancer ([place])    colon cancer 2010, kidney cancer 2010   Chemotherapy-induced neuropathy ([place])    OXALIPLATINUM CHEMO DRUG--  BILATERAL DISTAL UPPER AND LOWER EXTREMITIES   Colon cancer ([place])    Difficult intubation    Elevated PSA    GERD (gastroesophageal reflux disease)    Glaucoma 06/09/2012   Heart murmur    as a child    History of colon cancer 05/31/2012   History of colon cancer, stage III ONCOLOGIST--  DR Allayne Gitelman--  NO RECURRENCE   SIGMOID COLON CA  STAGE IIIA ,  T2, N1--  S/P COLONECTOMY AND CHEMOTHERAPY   History of colon polyps    History of gastric polyp    History of kidney cancer 05/31/2012   History of prostate cancer 01/27/2014   History of renal cell carcinoma 05/31/2012   STAGE I  GRADE 2  FUHRMAN--  S/P PARTIAL LEFT NEPHRECOTMY WITH NEGATIVE MARGINS   Hyperlipidemia    Hypertension    Neuromuscular disorder ([place])    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    OSA on CPAP    cpap settings- 10    Pneumonia    hx of    Prostate cancer ([place]) 08/18/2013   s/p prostatectomy   Pulmonary nodule    RIGHT MIDDLE LOBE PER CT 10-26-2013  STABLE   Sleep apnea    cpap   Tubular adenoma    Wears glasses    Wears hearing aid    BILATERAL   Past Surgical History:  Procedure Laterality Date   ANAL RECTAL MANOMETRY N/A 08/06/2022   Procedure: ANO RECTAL  MANOMETRY;  Surgeon: Lavena Bullion, DO;  Location: [facility];  Service: Gastroenterology;  Laterality: N/A;   CATARACT EXTRACTION W/ INTRAOCULAR LENS  IMPLANT, BILATERAL  04/2011 & 12/2011   COLONOSCOPY  07/05/2014   ESOPHAGOGASTRODUODENOSCOPY  10/13/2011   IMAGE GUIDED SINUS SURGERY N/A 05/07/2022   Procedure: IMAGE GUIDED SINUS SURGERY;  Surgeon: Carloyn Manner, MD;  Location: [facility];  Service: ENT;  Laterality: N/A;  NEED STRYKER DISK gave disk to Manuela Schwartz 02/03/22   INGUINAL HERNIA REPAIR Left 01/16/1977   KNEE ARTHROSCOPY Left 09/19/2007   LAPAROSCOPIC SIGMOID COLECTOMY  03/09/2009  ([place])   LYMPHADENECTOMY Bilateral 01/27/2014   Procedure: BILATERAL PELVIC LYMPH NODE DISSECTION;  Surgeon: Alexis Frock, MD;  Location: [facility];  Service: Urology;  Laterality: Bilateral;   POLYPECTOMY Right 05/07/2022   Procedure: POLYPECTOMY NASAL;  Surgeon: Carloyn Manner, MD;  Location: [facility];  Service: ENT;  Laterality: Right;   PROSTATE BIOPSY N/A 11/30/2013   Procedure: SATURATION BIOPSY TRANSRECTAL ULTRASONIC PROSTATE (TUBP);  Surgeon: Alexis Frock, MD;  Location[facility];  Service: Urology;  Laterality: N/A;   ROBOT ASSISTED  LAPAROSCOPIC RADICAL PROSTATECTOMY N/A 01/27/2014   Procedure: ROBOTIC ASSISTED LAPAROSCOPIC RADICAL PROSTATECTOMY WITH INDOCYANINE GREEN DYE,EXTENSIVE ADHESIOLYSIS " PRE -PERITONEAL";  Surgeon: Alexis Frock, MD;  Location: [facility];  Service: Urology;  Laterality: N/A;   ROBOTIC ASSITED PARTIAL NEPHRECTOMY Left 03/21/2012   TURBINATE REDUCTION Right 05/07/2022   Procedure: INFERIOR TURBINATE REDUCTION;  Surgeon: Carloyn Manner, MD;  Location: [facility];  Service: ENT;  Laterality: Right;  sleep apnea   Patient Active Problem List   Diagnosis Date Noted   Abdominal hernia without obstruction and without gangrene 01/01/2016   Quality of life palliative care encounter 01/01/2016   Morbid obesity ([place])  36/14/4315   Nonalcoholic fatty liver disease 05/15/2014   History of prostate cancer 01/27/2014   BPH (benign prostatic hyperplasia) 06/09/2012   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[place]) 06/09/2012   Sleep apnea, obstructive 06/09/2012   HTN (hypertension), benign 06/09/2012   GERD (gastroesophageal reflux disease) 06/09/2012   Glaucoma 06/09/2012   Gastric polyps 06/09/2012   Colon polyps 06/09/2012   History of colon cancer 05/31/2012   History of kidney cancer 05/31/2012    PCP: Deland Pretty, MD  REFERRING PROVIDER: Lavena Bullion, DO  REFERRING DIAG: K59.00 (ICD-10-CM) - Constipation, unspecified constipation type  THERAPY DIAG:  Abnormal posture  Muscle weakness (generalized)  Unspecified lack of coordination  Rationale for Evaluation and Treatment: Rehabilitation  ONSET DATE: 2021 (right after COVID)  SUBJECTIVE:                                                                                                                                                                                           09/02/22 SUBJECTIVE STATEMENT: Pt states that he has been dealing with constipation since after he had COVID in 2021. He was instructed in 2022 to take Miralax 2x/daily and that keeps bowel movements soft. He does report some low back pain that he has exercises for. Fluid intake: Yes: water with dinner, coffee    PAIN:  Are you having pain? No   PRECAUTIONS: None  WEIGHT BEARING RESTRICTIONS: No  FALLS:  Has patient fallen in last 6 months? 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, prostatectomy 2015, kidney cancer, Rt abdominal hernia Sexual abuse: No  BOWEL MOVEMENT: Pain with bowel movement: No Type of  bowel movement:Type (Bristol Stool Scale) 5, Frequency 1x/day, and Strain No Fully empty rectum: No Leakage: Yes: hard to get clean after bowel movements Pads: No  Fiber supplement: No - take Miralax *Has to look to see how much has come out  URINATION: Pain with urination: No Fully empty bladder: Yes: - Stream: Strong Urgency: Yes: sometimes, worse when he stands after sitting for a while Frequency: 1x/night, every 2-3 hours Leakage: Coughing, Sneezing, and Laughing Pads: Yes: small pad daily Tries to do 50 kegels a day  INTERCOURSE: Pain with intercourse: no pain - not recently  Ability to have erection: some amount   OBJECTIVE:    09/02/22: DIAGNOSTIC FINDINGS:  Colonoscopy 05/20/22 with no evidence of cancer recurrence  Anal manometry in 2023: could not expel balloon  COGNITION: Overall cognitive status: Within functional limits for tasks assessed     SENSATION: Light touch: Appears intact Proprioception: Appears intact  MUSCLE LENGTH:   FUNCTIONAL TESTS: Able to perform single leg stance   GAIT: Comments: decreased bil hip extension  POSTURE: rounded shoulders, forward head, decreased lumbar lordosis, increased thoracic kyphosis, posterior pelvic tilt, and Lt thoracic kyphosis, elevated Lt shoulder, Rt sacral rotation  PELVIC ALIGNMENT: Rt sacral rotation  LUMBARAROM/PROM:  A/PROM A/PROM  Eval (% available)  Flexion 50  Extension 50  Right lateral flexion 50  Left lateral flexion 50  Right rotation 75  Left rotation 75   (Blank rows = not tested)  LOWER EXTREMITY ROM: significant decrease in bil hip IR; limited hip extension    LOWER EXTREMITY MMT: Grossly 5/5 with exception of 4/5 adductor strength bil   PALPATION:   General  core weakness, not able to perform sit up, very large abdominal hernia that pt states has been progressing over the last couple of years, no tenderness throughout abdomen; hardness palpated in left lower quadrant that is  consistent with back up of stool; restriction over bladder                External Perineal Exam NA                             Internal Pelvic Floor NA  Patient confirms identification and approves PT to assess internal pelvic floor and treatment Yes next treatment session  PELVIC MMT: NA        TONE: NA  TODAY'S TREATMENT:                                                                                                                              DATE: 09/02/22  EVAL  Therapeutic activities: Squatty potty Relaxed toileting mechanics Bowel massage Balloon breathing Pressure management/exhaling with effort    PATIENT EDUCATION:  Education details: see above Person educated: Patient Education method: Consulting civil engineer, Media planner, Corporate treasurer cues, Verbal cues, and Handouts Education comprehension: verbalized understanding  HOME EXERCISE PROGRAM: Written hand out  ASSESSMENT:  CLINICAL IMPRESSION: Patient is a 72 y.o. male who was seen today for physical therapy evaluation and treatment for chronic constipation. Exam findings notable for large abdominal hernia, back up  of bowel movement into descending colon, core weakness that is impacting functional ability, poor pressure management (bulging in core/hernia with activity), limited hip IR/extension, and decreased lumbar A/ROM; no internal rectal exam performed this session due to time constraints, but we will plan to next treatment session. Signs and symptoms are most consistent with poor pressure management that is impacting his ability to appropriately push bowel movement through rectum; in addition, abundant abdominal/pelvic scar tissue is likely impacting coordination of pelvic floor and abdominal muscles. Initial treatment consisted on balloon breathing, squatty potty, relaxed toileting mechanics, and bowel massage to help improve ease of evacuation. He will continue to benefit from skilled PT intervention in order to improve  complete evacuation and QOL.   OBJECTIVE IMPAIRMENTS: decreased activity tolerance, decreased coordination, decreased endurance, decreased mobility, decreased strength, increased fascial restrictions, increased muscle spasms, postural dysfunction, and pain.   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   REHAB POTENTIAL: Good  CLINICAL DECISION MAKING: Stable/uncomplicated  EVALUATION COMPLEXITY: Low   GOALS: Goals reviewed with patient? Yes  SHORT TERM GOALS: Target date: 10/07/22  Pt will be independent with HEP.   Baseline: Goal status: INITIAL  2.  Pt will be independent with use of squatty potty, relaxed toileting mechanics, and improved bowel movement techniques in order to increase ease of bowel movements and complete evacuation.   Baseline:  Goal status: INITIAL  3.  Pt will increase all impaired lumbar A/ROM by 25% without pain.  Baseline:  Goal status: INITIAL  4.  Pt will be able to teach back appropriate pressure management techniques with breathing in order to improve core facilitate and have appropriate pressure management in functional activities.  Baseline:  Goal status: INITIAL    LONG TERM GOALS: Target date: 11/11/22  Pt will be independent with advanced HEP.   Baseline:  Goal status: INITIAL  2.  Pt will demonstrate improvement in bil hip IR/extension in order to improve pelvic mobility.  Baseline:  Goal status: INITIAL  3.  Pt will report no episodes of urinary or fecal incontinence in order to improve confidence in community activities and personal hygiene.   Baseline:  Goal status: INITIAL  4.  Pt will report daily bowel movement, without straining, and complete emptying without any leaking afterwards.   Baseline:  Goal status: INITIAL  5.  Pt will report no leaks with laughing, coughing, sneezing in order to improve comfort with interpersonal relationships and community activities.   Baseline:  Goal status: INITIAL  6.  Pt will demonstrate normal pelvic floor muscle tone and A/ROM, able to achieve 4/5 strength with contractions and 10 sec endurance, in order to provide appropriate lumbopelvic support in functional activities.   Baseline:  Goal status: INITIAL   PLAN:  PT FREQUENCY: 1-2x/week  PT DURATION: 12 weeks  PLANNED INTERVENTIONS: Therapeutic exercises, Therapeutic activity, Neuromuscular re-education, Balance training, Gait training, Patient/Family education, Self Care, Joint mobilization, Dry Needling, Biofeedback, and Manual therapy  PLAN FOR NEXT SESSION: perform internal rectal assessment of pelvic floor; begin core training   Heather Roberts, PT, DPT01/16/245:17 PM

## 2022-09-02 NOTE — Patient Instructions (Signed)
Squatty potty: When your knees are level or below the level of your hips, pelvic floor muscles are pressed against rectum, preventing ease of bowel movement. By getting knees above the level of the hips, these pelvic floor muscles relax, allowing easier passage of bowel movement. Ways to get knees above hips: o Squatty Potty (7inch and 9inch versions) o Small stool o Roll of toilet paper under each foot o Hardback book or stack of magazines under each foot  Relaxed Toileting mechanics: Once in this position, make sure to lean forward with forearms on thighs, wide knees, relaxed stomach, and breathe.    Bowel massage: To assist with more regular and more comfortable bowel movements, try performing bowel massage nightly for 5-10 minutes. Place hands in the lower right side of your abdomen to start; in small circles, massage up, across, and down the left side of your abdomen. Pressure does not need to be hard, but just comfortable. You can use lotion or oil to make more comfortable.    Balloon Breathing!!!!    [facility] [address], [place], [place] [address] Phone # [phone] Fax [phone]

## 2022-09-24 DIAGNOSIS — K5902 Outlet dysfunction constipation: Secondary | ICD-10-CM

## 2022-10-13 ENCOUNTER — Ambulatory Visit: Payer: Medicare HMO

## 2022-10-20 ENCOUNTER — Ambulatory Visit: Payer: Medicare HMO | Attending: Gastroenterology

## 2022-10-20 DIAGNOSIS — R279 Unspecified lack of coordination: Secondary | ICD-10-CM | POA: Insufficient documentation

## 2022-10-20 DIAGNOSIS — R293 Abnormal posture: Secondary | ICD-10-CM | POA: Insufficient documentation

## 2022-10-20 DIAGNOSIS — M6281 Muscle weakness (generalized): Secondary | ICD-10-CM | POA: Diagnosis present

## 2022-10-20 NOTE — Therapy (Signed)
OUTPATIENT PHYSICAL THERAPY TREATMENT NOTE   Patient Name: Scott Hayden MRN: [id number] DOB:Jan 04, 1951, 72 y.o., male Today's Date: 10/20/2022  PCP: Deland Pretty, MD REFERRING PROVIDER: Lavena Bullion, DO   END OF SESSION:   PT End of Session - 10/20/22 1402     Visit Number 2    Date for PT Re-Evaluation 11/11/22    Authorization Type [employer]    PT Start Time 1400    PT Stop Time 1440    PT Time Calculation (min) 40 min    Activity Tolerance Patient tolerated treatment well    Behavior During Therapy WFL for tasks assessed/performed             Past Medical History:  Diagnosis Date   Allergy    BPH (benign prostatic hypertrophy)    Cancer ([place])    colon cancer 2010, kidney cancer 2010   Chemotherapy-induced neuropathy ([place])    OXALIPLATINUM CHEMO DRUG--  BILATERAL DISTAL UPPER AND LOWER EXTREMITIES   Colon cancer ([place])    Difficult intubation    Elevated PSA    GERD (gastroesophageal reflux disease)    Glaucoma 06/09/2012   Heart murmur    as a child    History of colon cancer 05/31/2012   History of colon cancer, stage III ONCOLOGIST--  DR Allayne Gitelman--  NO RECURRENCE   SIGMOID COLON CA  STAGE IIIA ,  T2, N1--  S/P COLONECTOMY AND CHEMOTHERAPY   History of colon polyps    History of gastric polyp    History of kidney cancer 05/31/2012   History of prostate cancer 01/27/2014   History of renal cell carcinoma 05/31/2012   STAGE I  GRADE 2  FUHRMAN--  S/P PARTIAL LEFT NEPHRECOTMY WITH NEGATIVE MARGINS   Hyperlipidemia    Hypertension    Neuromuscular disorder ([place])    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    OSA on CPAP    cpap settings- 10    Pneumonia    hx of    Prostate cancer ([place]) 08/18/2013   s/p prostatectomy   Pulmonary nodule    RIGHT MIDDLE LOBE PER CT 10-26-2013  STABLE   Sleep apnea    cpap   Tubular adenoma    Wears glasses    Wears hearing aid    BILATERAL   Past Surgical History:  Procedure Laterality Date   ANAL  RECTAL MANOMETRY N/A 08/06/2022   Procedure: ANO RECTAL MANOMETRY;  Surgeon: Lavena Bullion, DO;  Location: [facility];  Service: Gastroenterology;  Laterality: N/A;   CATARACT EXTRACTION W/ INTRAOCULAR LENS  IMPLANT, BILATERAL  04/2011 & 12/2011   COLONOSCOPY  07/05/2014   ESOPHAGOGASTRODUODENOSCOPY  10/13/2011   IMAGE GUIDED SINUS SURGERY N/A 05/07/2022   Procedure: IMAGE GUIDED SINUS SURGERY;  Surgeon: Carloyn Manner, MD;  Location: [place];  Service: ENT;  Laterality: N/A;  NEED STRYKER DISK gave disk to Manuela Schwartz 02/03/22   INGUINAL HERNIA REPAIR Left 01/16/1977   KNEE ARTHROSCOPY Left 09/19/2007   LAPAROSCOPIC SIGMOID COLECTOMY  03/09/2009  ([place])   LYMPHADENECTOMY Bilateral 01/27/2014   Procedure: BILATERAL PELVIC LYMPH NODE DISSECTION;  Surgeon: Alexis Frock, MD;  Location: [facility];  Service: Urology;  Laterality: Bilateral;   POLYPECTOMY Right 05/07/2022   Procedure: POLYPECTOMY NASAL;  Surgeon: Carloyn Manner, MD;  Location: [place];  Service: ENT;  Laterality: Right;   PROSTATE BIOPSY N/A 11/30/2013   Procedure: SATURATION BIOPSY TRANSRECTAL ULTRASONIC PROSTATE (TUBP);  Surgeon: Alexis Frock, MD;  Location: [facility];  Service: Urology;  Laterality: N/A;   ROBOT ASSISTED LAPAROSCOPIC RADICAL PROSTATECTOMY N/A 01/27/2014   Procedure: ROBOTIC ASSISTED LAPAROSCOPIC RADICAL PROSTATECTOMY WITH INDOCYANINE GREEN DYE,EXTENSIVE ADHESIOLYSIS " PRE -PERITONEAL";  Surgeon: Alexis Frock, MD;  Location: [facility];  Service: Urology;  Laterality: N/A;   ROBOTIC ASSITED PARTIAL NEPHRECTOMY Left 03/21/2012   TURBINATE REDUCTION Right 05/07/2022   Procedure: INFERIOR TURBINATE REDUCTION;  Surgeon: Carloyn Manner, MD;  Location: [place];  Service: ENT;  Laterality: Right;  sleep apnea   Patient Active Problem List   Diagnosis Date Noted   Constipation due to outlet dysfunction 09/24/2022   Dyssynergic defecation 09/24/2022    Abdominal hernia without obstruction and without gangrene 01/01/2016   Quality of life palliative care encounter 01/01/2016   Morbid obesity ([place]) 0000000   Nonalcoholic fatty liver disease 05/15/2014   History of prostate cancer 01/27/2014   BPH (benign prostatic hyperplasia) 06/09/2012   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[place]) 06/09/2012   Sleep apnea, obstructive 06/09/2012   HTN (hypertension), benign 06/09/2012   GERD (gastroesophageal reflux disease) 06/09/2012   Glaucoma 06/09/2012   Gastric polyps 06/09/2012   Colon polyps 06/09/2012   History of colon cancer 05/31/2012   History of kidney cancer 05/31/2012    REFERRING DIAG: K59.00 (ICD-10-CM) - Constipation, unspecified constipation type  THERAPY DIAG:  Abnormal posture  Muscle weakness (generalized)  Unspecified lack of coordination  Rationale for Evaluation and Treatment Rehabilitation  PERTINENT HISTORY: 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 He did not perform regular bowel massage. He has been playing with how much miralax he has been taking.    PAIN:  Are you having pain? No  09/02/22 SUBJECTIVE STATEMENT: Pt states that he has been dealing with constipation since after he had COVID in 2021. He was instructed in 2022 to take Miralax 2x/daily and that keeps bowel movements soft. He does report some low back pain that he has exercises for. Fluid intake: Yes: water with dinner, coffee     PAIN:  Are you having pain? No     PRECAUTIONS: None   WEIGHT BEARING RESTRICTIONS: No   FALLS:  Has  patient fallen in last 6 months? 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, prostatectomy 2015, kidney cancer, Rt abdominal hernia Sexual abuse: No   BOWEL MOVEMENT: Pain with bowel movement: No Type of bowel movement:Type (Bristol Stool Scale) 5, Frequency 1x/day, and Strain No Fully empty rectum: No Leakage: Yes: hard to get clean after bowel movements Pads: No Fiber supplement: No - take Miralax *Has to look to see how much has come out   URINATION: Pain with urination: No Fully empty bladder: Yes: - Stream: Strong Urgency: Yes: sometimes, worse when he stands after sitting for a while Frequency: 1x/night, every 2-3 hours Leakage: Coughing, Sneezing, and Laughing Pads: Yes: small pad daily Tries to do 50 kegels a day   INTERCOURSE: Pain with intercourse: no pain - not recently  Ability to have erection: some amount     OBJECTIVE:  10/20/22:    09/02/22: DIAGNOSTIC FINDINGS:  Colonoscopy 05/20/22 with no evidence of cancer recurrence  Anal manometry in 2023: could not expel balloon   COGNITION: Overall cognitive status: Within functional limits for tasks assessed                          SENSATION: Light touch: Appears intact Proprioception: Appears intact   MUSCLE LENGTH:     FUNCTIONAL TESTS: Able to perform single leg stance     GAIT: Comments: decreased bil hip extension   POSTURE: rounded shoulders, forward head, decreased lumbar lordosis, increased thoracic kyphosis, posterior pelvic tilt, and Lt thoracic kyphosis, elevated Lt shoulder, Rt sacral rotation   PELVIC ALIGNMENT: Rt sacral rotation   LUMBARAROM/PROM:   A/PROM A/PROM  Eval (% available)  Flexion 50  Extension 50  Right lateral  flexion 50  Left lateral flexion 50  Right rotation 75  Left rotation 75   (Blank rows = not tested)   LOWER EXTREMITY ROM: significant decrease in bil hip IR; limited hip extension       LOWER EXTREMITY MMT: Grossly 5/5 with exception of 4/5 adductor strength bil     PALPATION:   General  core weakness, not able to perform sit up, very large abdominal hernia that pt states has been progressing over the last couple of years, no tenderness throughout abdomen; hardness palpated in left lower quadrant that is consistent with back up of stool; restriction over bladder                 External Perineal Exam NA                             Internal Pelvic Floor NA   Patient confirms identification and approves PT to assess internal pelvic floor and treatment Yes next treatment session   PELVIC MMT: NA         TONE: NA   TODAY'S TREATMENT DATE:  10/20/22 Manual: Bowel mobilization Exercises: Open books 10x bil Lower trunk rotation 2 x 10 Piriformis stretch 60 sec bil Therapeutic activities: Increasing water intake Self-massage Fiber supplement Practice toilet mechanics with balloon breathing  09/02/22  EVAL  Therapeutic activities: Squatty potty Relaxed toileting mechanics Bowel massage Balloon breathing Pressure management/exhaling with effort       PATIENT EDUCATION:  Education details: see above Person educated: Patient Education method: Explanation, Demonstration, Tactile cues, Verbal cues, and Handouts Education comprehension: verbalized understanding  HOME EXERCISE PROGRAM: Written hand out   ASSESSMENT:   CLINICAL IMPRESSION: Due to progress and pt being hesitant about internal rectal exam, we decided to focus on toilet mechanics practice, bowel massage, and mobility exercises. We reviewed balloon breathing and practiced in seated position with squatty potty; he required moderate level of cuing in order to get appropriate increase in abdominal pressure  without holding his breath. Observing bed mobility and exercises, he needs continued education on pressure management in these activities. Good tolerance to mobility exercises; believe that he will benefit from core strengthening in order to help achieve better pressure management. He will continue to benefit from skilled PT intervention in order to improve complete evacuation and QOL.    OBJECTIVE IMPAIRMENTS: decreased activity tolerance, decreased coordination, decreased endurance, decreased mobility, decreased strength, increased fascial restrictions, increased muscle spasms, postural dysfunction, and pain.    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    REHAB POTENTIAL: Good   CLINICAL DECISION MAKING: Stable/uncomplicated   EVALUATION COMPLEXITY: Low     GOALS: Goals reviewed with patient? Yes   SHORT TERM GOALS: Target date: 10/07/22   Pt will be independent with HEP.    Baseline: Goal status: INITIAL   2.  Pt will be independent with use of squatty potty, relaxed toileting mechanics, and improved bowel movement techniques in order to increase ease of bowel movements and complete evacuation.    Baseline:  Goal status: INITIAL   3.  Pt will increase all impaired lumbar A/ROM by 25% without pain.   Baseline:  Goal status: INITIAL   4.  Pt will be able to teach back appropriate pressure management techniques with breathing in order to improve core facilitate and have appropriate pressure management in functional activities.  Baseline:  Goal status: INITIAL       LONG TERM GOALS: Target date: 11/11/22   Pt will be independent with advanced HEP.    Baseline:  Goal status: INITIAL   2.  Pt will demonstrate improvement in bil hip  IR/extension in order to improve pelvic mobility.  Baseline:  Goal status: INITIAL   3.  Pt will report no episodes of urinary or fecal incontinence in order to improve confidence in community activities and personal hygiene.    Baseline:  Goal status: INITIAL   4.  Pt will report daily bowel movement, without straining, and complete emptying without any leaking afterwards.  Baseline:  Goal status: INITIAL   5.  Pt will report no leaks with laughing, coughing, sneezing in order to improve comfort with interpersonal relationships and community activities.    Baseline:  Goal status: INITIAL   6.  Pt will demonstrate normal pelvic floor muscle tone and A/ROM, able to achieve 4/5 strength with contractions and 10 sec endurance, in order to provide appropriate lumbopelvic support in functional activities.    Baseline:  Goal status: INITIAL     PLAN:   PT FREQUENCY: 1-2x/week   PT DURATION: 12 weeks   PLANNED INTERVENTIONS: Therapeutic exercises, Therapeutic activity, Neuromuscular re-education, Balance training, Gait training, Patient/Family education, Self Care, Joint mobilization, Dry Needling, Biofeedback, and Manual therapy   PLAN FOR NEXT SESSION: perform internal rectal assessment of pelvic floor; begin core training; progress mobility exercises   Heather Roberts, PT, DPT03/04/242:46 PM

## 2022-10-27 ENCOUNTER — Ambulatory Visit: Payer: Medicare HMO

## 2022-10-27 DIAGNOSIS — R293 Abnormal posture: Secondary | ICD-10-CM

## 2022-10-27 DIAGNOSIS — M6281 Muscle weakness (generalized): Secondary | ICD-10-CM

## 2022-10-27 DIAGNOSIS — R279 Unspecified lack of coordination: Secondary | ICD-10-CM

## 2022-10-27 NOTE — Therapy (Signed)
OUTPATIENT PHYSICAL THERAPY TREATMENT NOTE   Patient Name: Scott Hayden MRN: [id number] DOB:September 15, 1950, 72 y.o., male Today's Date: 10/27/2022  PCP: Deland Pretty, MD REFERRING PROVIDER: Lavena Bullion, DO   END OF SESSION:   PT End of Session - 10/27/22 1618     Visit Number 3    Date for PT Re-Evaluation 11/11/22    Authorization Type [employer]    PT Start Time 1616    PT Stop Time 1655    PT Time Calculation (min) 39 min    Activity Tolerance Patient tolerated treatment well    Behavior During Therapy WFL for tasks assessed/performed              Past Medical History:  Diagnosis Date   Allergy    BPH (benign prostatic hypertrophy)    Cancer ([place])    colon cancer 2010, kidney cancer 2010   Chemotherapy-induced neuropathy ([place])    OXALIPLATINUM CHEMO DRUG--  BILATERAL DISTAL UPPER AND LOWER EXTREMITIES   Colon cancer ([place])    Difficult intubation    Elevated PSA    GERD (gastroesophageal reflux disease)    Glaucoma 06/09/2012   Heart murmur    as a child    History of colon cancer 05/31/2012   History of colon cancer, stage III ONCOLOGIST--  DR Allayne Gitelman--  NO RECURRENCE   SIGMOID COLON CA  STAGE IIIA ,  T2, N1--  S/P COLONECTOMY AND CHEMOTHERAPY   History of colon polyps    History of gastric polyp    History of kidney cancer 05/31/2012   History of prostate cancer 01/27/2014   History of renal cell carcinoma 05/31/2012   STAGE I  GRADE 2  FUHRMAN--  S/P PARTIAL LEFT NEPHRECOTMY WITH NEGATIVE MARGINS   Hyperlipidemia    Hypertension    Neuromuscular disorder (HCC)    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    OSA on CPAP    cpap settings- 10    Pneumonia    hx of    Prostate cancer ([place]) 08/18/2013   s/p prostatectomy   Pulmonary nodule    RIGHT MIDDLE LOBE PER CT 10-26-2013  STABLE   Sleep apnea    cpap   Tubular adenoma    Wears glasses    Wears hearing aid    BILATERAL   Past Surgical History:  Procedure Laterality Date   ANAL  RECTAL MANOMETRY N/A 08/06/2022   Procedure: ANO RECTAL MANOMETRY;  Surgeon: Lavena Bullion, DO;  Location: [facility];  Service: Gastroenterology;  Laterality: N/A;   CATARACT EXTRACTION W/ INTRAOCULAR LENS  IMPLANT, BILATERAL  04/2011 & 12/2011   COLONOSCOPY  07/05/2014   ESOPHAGOGASTRODUODENOSCOPY  10/13/2011   IMAGE GUIDED SINUS SURGERY N/A 05/07/2022   Procedure: IMAGE GUIDED SINUS SURGERY;  Surgeon: Carloyn Manner, MD;  Location: [facility];  Service: ENT;  Laterality: N/A;  NEED STRYKER DISK gave disk to Manuela Schwartz 02/03/22   INGUINAL HERNIA REPAIR Left 01/16/1977   KNEE ARTHROSCOPY Left 09/19/2007   LAPAROSCOPIC SIGMOID COLECTOMY  03/09/2009  ([place])   LYMPHADENECTOMY Bilateral 01/27/2014   Procedure: BILATERAL PELVIC LYMPH NODE DISSECTION;  Surgeon: Alexis Frock, MD;  Location: [facility];  Service: Urology;  Laterality: Bilateral;   POLYPECTOMY Right 05/07/2022   Procedure: POLYPECTOMY NASAL;  Surgeon: Carloyn Manner, MD;  Location: [place];  Service: ENT;  Laterality: Right;   PROSTATE BIOPSY N/A 11/30/2013   Procedure: SATURATION BIOPSY TRANSRECTAL ULTRASONIC PROSTATE (TUBP);  Surgeon: Alexis Frock, MD;  Location: [facility];  Service: Urology;  Laterality: N/A;   ROBOT ASSISTED LAPAROSCOPIC RADICAL PROSTATECTOMY N/A 01/27/2014   Procedure: ROBOTIC ASSISTED LAPAROSCOPIC RADICAL PROSTATECTOMY WITH INDOCYANINE GREEN DYE,EXTENSIVE ADHESIOLYSIS " PRE -PERITONEAL";  Surgeon: Alexis Frock, MD;  Location: [facility];  Service: Urology;  Laterality: N/A;   ROBOTIC ASSITED PARTIAL NEPHRECTOMY Left 03/21/2012   TURBINATE REDUCTION Right 05/07/2022   Procedure: INFERIOR TURBINATE REDUCTION;  Surgeon: Carloyn Manner, MD;  Location: [place];  Service: ENT;  Laterality: Right;  sleep apnea   Patient Active Problem List   Diagnosis Date Noted   Constipation due to outlet dysfunction 09/24/2022   Dyssynergic defecation 09/24/2022    Abdominal hernia without obstruction and without gangrene 01/01/2016   Quality of life palliative care encounter 01/01/2016   Morbid obesity ([place]) 0000000   Nonalcoholic fatty liver disease 05/15/2014   History of prostate cancer 01/27/2014   BPH (benign prostatic hyperplasia) 06/09/2012   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[place]) 06/09/2012   Sleep apnea, obstructive 06/09/2012   HTN (hypertension), benign 06/09/2012   GERD (gastroesophageal reflux disease) 06/09/2012   Glaucoma 06/09/2012   Gastric polyps 06/09/2012   Colon polyps 06/09/2012   History of colon cancer 05/31/2012   History of kidney cancer 05/31/2012    REFERRING DIAG: K59.00 (ICD-10-CM) - Constipation, unspecified constipation type  THERAPY DIAG:  Abnormal posture  Muscle weakness (generalized)  Unspecified lack of coordination  Rationale for Evaluation and Treatment Rehabilitation  PERTINENT HISTORY: 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 He feels like exercises are very helpful with low back tightness.    PAIN:  Are you having pain? No  09/02/22 SUBJECTIVE STATEMENT: Pt states that he has been dealing with constipation since after he had COVID in 2021. He was instructed in 2022 to take Miralax 2x/daily and that keeps bowel movements soft. He does report some low back pain that he has exercises for. Fluid intake: Yes: water with dinner, coffee     PAIN:  Are you having pain? No     PRECAUTIONS: None   WEIGHT BEARING RESTRICTIONS: No   FALLS:  Has patient fallen in last 6 months? 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, prostatectomy 2015, kidney cancer, Rt abdominal hernia Sexual abuse: No   BOWEL MOVEMENT: Pain with bowel movement: No Type of bowel movement:Type (Bristol Stool Scale) 5, Frequency 1x/day, and Strain No Fully empty rectum: No Leakage: Yes: hard to get clean after bowel movements Pads: No Fiber supplement: No - take Miralax *Has to look to see how much has come out   URINATION: Pain with urination: No Fully empty bladder: Yes: - Stream: Strong Urgency: Yes: sometimes, worse when he stands after sitting for a while Frequency: 1x/night, every 2-3 hours Leakage: Coughing, Sneezing, and Laughing Pads: Yes: small pad daily Tries to do 50 kegels a day   INTERCOURSE: Pain with intercourse: no pain - not recently  Ability to have erection: some amount     OBJECTIVE:  10/20/22:    09/02/22: DIAGNOSTIC FINDINGS:  Colonoscopy 05/20/22 with no evidence of cancer recurrence  Anal manometry in 2023: could not expel balloon   COGNITION: Overall cognitive status: Within functional limits for tasks assessed                          SENSATION: Light touch: Appears intact Proprioception: Appears intact   MUSCLE LENGTH:     FUNCTIONAL TESTS: Able to perform single leg stance     GAIT: Comments: decreased bil hip extension   POSTURE: rounded shoulders, forward head, decreased lumbar lordosis, increased thoracic kyphosis, posterior pelvic tilt, and Lt thoracic kyphosis, elevated Lt shoulder, Rt sacral rotation   PELVIC ALIGNMENT: Rt sacral rotation   LUMBARAROM/PROM:   A/PROM A/PROM  Eval (% available)  Flexion 50  Extension 50  Right lateral flexion 50  Left lateral flexion 50  Right rotation  75  Left rotation 75   (Blank rows = not tested)   LOWER EXTREMITY ROM: significant decrease in bil hip IR; limited hip extension       LOWER EXTREMITY MMT: Grossly 5/5 with exception of 4/5 adductor strength bil     PALPATION:   General  core weakness, not able to perform sit up, very large abdominal hernia that pt states has been progressing over the last couple of years, no tenderness throughout abdomen; hardness palpated in left lower quadrant that is consistent with back up of stool; restriction over bladder                 External Perineal Exam NA                             Internal Pelvic Floor NA   Patient confirms identification and approves PT to assess internal pelvic floor and treatment Yes next treatment session   PELVIC MMT: NA         TONE: NA   TODAY'S TREATMENT DATE:  10/27/22 Neuromuscular re-education: Transversus abdominus training with multimodal cues for improved motor control and breath coordination Supine UE ball press 10 Sidelying UE ball press 2 x 10 Exercises: Clam shells 2 x 10 bil Reverse clam shells 2 x 10 bil Bridge with hip adduction Seated hip adduction 15x Seated hip abduction 15x Seated hip internal rotation 15x  10/20/22 Manual: Bowel mobilization Exercises: Open books 10x bil Lower trunk rotation 2 x 10 Piriformis stretch 60 sec bil Therapeutic activities: Increasing water intake Self-massage Fiber supplement Practice toilet  mechanics with balloon breathing  09/02/22  EVAL  Therapeutic activities: Squatty potty Relaxed toileting mechanics Bowel massage Balloon breathing Pressure management/exhaling with effort       PATIENT EDUCATION:  Education details: see above Person educated: Patient Education method: Consulting civil engineer, Demonstration, Tactile cues, Verbal cues, and Handouts Education comprehension: verbalized understanding   HOME EXERCISE PROGRAM: Written hand out   ASSESSMENT:   CLINICAL IMPRESSION: Pt  seeing excellent progress with bowel movements, having one daily with no straining. He was able to progress core/hip strengthening; he had significant difficulty with coordinating breathing and breath holding. With cues he did make improvements. Believe focusing on not breath holding is most appropriate level currently instead of specific breath coordination. HEP updated. He will continue to benefit from skilled PT intervention in order to improve complete evacuation and QOL.    OBJECTIVE IMPAIRMENTS: decreased activity tolerance, decreased coordination, decreased endurance, decreased mobility, decreased strength, increased fascial restrictions, increased muscle spasms, postural dysfunction, and pain.    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    REHAB POTENTIAL: Good   CLINICAL DECISION MAKING: Stable/uncomplicated   EVALUATION COMPLEXITY: Low     GOALS: Goals reviewed with patient? Yes   SHORT TERM GOALS: Target date: 10/07/22   Pt will be independent with HEP.    Baseline: Goal status: INITIAL   2.  Pt will be independent with use of squatty potty, relaxed toileting mechanics, and improved bowel movement techniques in order to increase ease of bowel movements and complete evacuation.    Baseline:  Goal status: INITIAL   3.  Pt will increase all impaired lumbar A/ROM by 25% without pain.   Baseline:  Goal status: INITIAL   4.  Pt will be able to teach back appropriate pressure management techniques with breathing in order to improve core facilitate and have appropriate pressure management in functional activities.  Baseline:  Goal status: INITIAL       LONG TERM GOALS: Target date: 11/11/22   Pt will be independent with advanced  HEP.    Baseline:  Goal status: INITIAL   2.  Pt will demonstrate improvement in bil hip IR/extension in order to improve pelvic mobility.  Baseline:  Goal status: INITIAL   3.  Pt will report no episodes of urinary or fecal incontinence in order to improve confidence in community activities and personal hygiene.    Baseline:  Goal status: INITIAL   4.  Pt will report daily bowel movement, without straining, and complete emptying without any leaking afterwards.  Baseline:  Goal status: INITIAL   5.  Pt will report no leaks with laughing, coughing, sneezing in order to improve comfort with interpersonal relationships and community activities.    Baseline:  Goal status: INITIAL   6.  Pt will demonstrate normal pelvic floor muscle tone and A/ROM, able to achieve 4/5 strength with contractions and 10 sec endurance, in order to provide appropriate lumbopelvic support in functional activities.    Baseline:  Goal status: INITIAL     PLAN:   PT FREQUENCY: 1-2x/week   PT DURATION: 12 weeks   PLANNED INTERVENTIONS: Therapeutic exercises, Therapeutic activity, Neuromuscular re-education, Balance training, Gait training, Patient/Family education, Self Care, Joint mobilization, Dry Needling, Biofeedback, and Manual therapy   PLAN FOR NEXT SESSION: perform internal rectal assessment of pelvic floor; begin core training;  progress mobility exercises   Heather Roberts, PT, DPT03/11/245:04 PM

## 2022-11-03 ENCOUNTER — Ambulatory Visit: Payer: Medicare HMO

## 2022-11-03 DIAGNOSIS — R279 Unspecified lack of coordination: Secondary | ICD-10-CM

## 2022-11-03 DIAGNOSIS — R293 Abnormal posture: Secondary | ICD-10-CM

## 2022-11-03 DIAGNOSIS — M6281 Muscle weakness (generalized): Secondary | ICD-10-CM

## 2022-11-03 NOTE — Therapy (Addendum)
OUTPATIENT PHYSICAL THERAPY TREATMENT NOTE   Patient Name: Scott Hayden MRN: [id number] DOB:06-06-1951, 72 y.o., male Today's Date: 11/03/2022  PCP: Merri Brunette, MD REFERRING PROVIDER: Shellia Cleverly, DO   END OF SESSION:   PT End of Session - 11/03/22 1404     Visit Number 4    Date for PT Re-Evaluation 11/11/22    Authorization Type [employer]    PT Start Time 1400    PT Stop Time 1440    PT Time Calculation (min) 40 min    Activity Tolerance Patient tolerated treatment well    Behavior During Therapy WFL for tasks assessed/performed               Past Medical History:  Diagnosis Date   Allergy    BPH (benign prostatic hypertrophy)    Cancer (HCC)    colon cancer 2010, kidney cancer 2010   Chemotherapy-induced neuropathy (HCC)    OXALIPLATINUM CHEMO DRUG--  BILATERAL DISTAL UPPER AND LOWER EXTREMITIES   Colon cancer (HCC)    Difficult intubation    Elevated PSA    GERD (gastroesophageal reflux disease)    Glaucoma 06/09/2012   Heart murmur    as a child    History of colon cancer 05/31/2012   History of colon cancer, stage III ONCOLOGIST--  DR Yvone Neu--  NO RECURRENCE   SIGMOID COLON CA  STAGE IIIA ,  T2, N1--  S/P COLONECTOMY AND CHEMOTHERAPY   History of colon polyps    History of gastric polyp    History of kidney cancer 05/31/2012   History of prostate cancer 01/27/2014   History of renal cell carcinoma 05/31/2012   STAGE I  GRADE 2  FUHRMAN--  S/P PARTIAL LEFT NEPHRECOTMY WITH NEGATIVE MARGINS   Hyperlipidemia    Hypertension    Neuromuscular disorder (HCC)    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    OSA on CPAP    cpap settings- 10    Pneumonia    hx of    Prostate cancer (HCC) 08/18/2013   s/p prostatectomy   Pulmonary nodule    RIGHT MIDDLE LOBE PER CT 10-26-2013  STABLE   Sleep apnea    cpap   Tubular adenoma    Wears glasses    Wears hearing aid    BILATERAL   Past Surgical History:  Procedure Laterality Date   ANAL  RECTAL MANOMETRY N/A 08/06/2022   Procedure: ANO RECTAL MANOMETRY;  Surgeon: Shellia Cleverly, DO;  Location: [facility];  Service: Gastroenterology;  Laterality: N/A;   CATARACT EXTRACTION W/ INTRAOCULAR LENS  IMPLANT, BILATERAL  04/2011 & 12/2011   COLONOSCOPY  07/05/2014   ESOPHAGOGASTRODUODENOSCOPY  10/13/2011   IMAGE GUIDED SINUS SURGERY N/A 05/07/2022   Procedure: IMAGE GUIDED SINUS SURGERY;  Surgeon: Bud Face, MD;  Location: [facility];  Service: ENT;  Laterality: N/A;  NEED STRYKER DISK gave disk to Darl Pikes 02/03/22   INGUINAL HERNIA REPAIR Left 01/16/1977   KNEE ARTHROSCOPY Left 09/19/2007   LAPAROSCOPIC SIGMOID COLECTOMY  03/09/2009  ([place])   LYMPHADENECTOMY Bilateral 01/27/2014   Procedure: BILATERAL PELVIC LYMPH NODE DISSECTION;  Surgeon: Sebastian Ache, MD;  Location: [facility];  Service: Urology;  Laterality: Bilateral;   POLYPECTOMY Right 05/07/2022   Procedure: POLYPECTOMY NASAL;  Surgeon: Bud Face, MD;  [facility];  Service: ENT;  Laterality: Right;   PROSTATE BIOPSY N/A 11/30/2013   Procedure: SATURATION BIOPSY TRANSRECTAL ULTRASONIC PROSTATE (TUBP);  Surgeon: Sebastian Ache, MD;  Location:  [facility];  Service: Urology;  Laterality: N/A;   ROBOT ASSISTED LAPAROSCOPIC RADICAL PROSTATECTOMY N/A 01/27/2014   Procedure: ROBOTIC ASSISTED LAPAROSCOPIC RADICAL PROSTATECTOMY WITH INDOCYANINE GREEN DYE,EXTENSIVE ADHESIOLYSIS " PRE -PERITONEAL";  Surgeon: Sebastian Ache, MD;  Location: [facility];  Service: Urology;  Laterality: N/A;   ROBOTIC ASSITED PARTIAL NEPHRECTOMY Left 03/21/2012   TURBINATE REDUCTION Right 05/07/2022   Procedure: INFERIOR TURBINATE REDUCTION;  Surgeon: Bud Face, MD;  Location: [facility];  Service: ENT;  Laterality: Right;  sleep apnea   Patient Active Problem List   Diagnosis Date Noted   Constipation due to outlet dysfunction 09/24/2022   Dyssynergic defecation 09/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    Abdominal hernia without obstruction and without gangrene 01/01/2016   Quality of life palliative care encounter 01/01/2016   Morbid obesity (HCC) 05/15/2014   Nonalcoholic fatty liver disease 05/15/2014   History of prostate cancer 01/27/2014   BPH (benign prostatic hyperplasia) 06/09/2012   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HCC) 06/09/2012   Sleep apnea, obstructive 06/09/2012   HTN (hypertension), benign 06/09/2012   GERD (gastroesophageal reflux disease) 06/09/2012   Glaucoma 06/09/2012   Gastric polyps 06/09/2012   Colon polyps 06/09/2012   History of colon cancer 05/31/2012   History of kidney cancer 05/31/2012    REFERRING DIAG: K59.00 (ICD-10-CM) - Constipation, unspecified constipation type  THERAPY DIAG:  Abnormal posture  Muscle weakness (generalized)  Unspecified lack of coordination  Rationale for Evaluation and Treatment Rehabilitation  PERTINENT HISTORY: 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 He thinks he would like to today to be his last session, but would like to keep his episode open through next week when he is traveling to make sure he continues to see progress with his condition. He is having a daily bowel movement without difficulty. He feels like the balloon breathing is the most helpful intervention.    PAIN:  Are you having pain? No  09/02/22 SUBJECTIVE STATEMENT: Pt states that he has been dealing with constipation since after he had COVID in 2021. He was instructed in 2022 to take Miralax 2x/daily and that keeps bowel movements soft. He does report some low back pain that he has exercises for. Fluid intake:  Yes: water with dinner, coffee     PAIN:  Are you having pain? No     PRECAUTIONS: None  WEIGHT BEARING RESTRICTIONS: No   FALLS:  Has patient fallen in last 6 months? 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, prostatectomy 2015, kidney cancer, Rt abdominal hernia Sexual abuse: No   BOWEL MOVEMENT: Pain with bowel movement: No Type of bowel movement:Type (Bristol Stool Scale) 5, Frequency 1x/day, and Strain No Fully empty rectum: No Leakage: Yes: hard to get clean after bowel movements Pads: No Fiber supplement: No - take Miralax *Has to look to see how much has come out   URINATION: Pain with urination: No Fully empty bladder: Yes: - Stream: Strong Urgency: Yes: sometimes, worse when he stands after sitting for a while Frequency: 1x/night, every 2-3 hours Leakage: Coughing, Sneezing, and Laughing Pads: Yes: small pad daily Tries to do 50 kegels a day   INTERCOURSE: Pain with intercourse: no pain - not recently  Ability to have erection: some amount     OBJECTIVE:  10/20/22:    09/02/22: DIAGNOSTIC FINDINGS:  Colonoscopy 05/20/22 with no evidence of cancer recurrence  Anal manometry in 2023: could not expel balloon   COGNITION: Overall cognitive status: Within functional limits for tasks assessed                          SENSATION: Light touch: Appears intact Proprioception: Appears intact   MUSCLE LENGTH:     FUNCTIONAL TESTS: Able to perform single leg stance     GAIT: Comments: decreased bil hip extension   POSTURE: rounded shoulders, forward head, decreased lumbar lordosis, increased thoracic kyphosis, posterior pelvic tilt, and Lt thoracic kyphosis, elevated Lt shoulder, Rt sacral rotation    PELVIC ALIGNMENT: Rt sacral rotation   LUMBARAROM/PROM:   A/PROM A/PROM  Eval (% available)  Flexion 50  Extension 50  Right lateral flexion 50  Left lateral flexion 50  Right rotation 75  Left rotation 75   (Blank rows = not tested)   LOWER EXTREMITY ROM: significant decrease in bil hip IR; limited hip extension       LOWER EXTREMITY MMT: Grossly 5/5 with exception of 4/5 adductor strength bil     PALPATION:   General  core weakness, not able to perform sit up, very large abdominal hernia that pt states has been progressing over the last couple of years, no tenderness throughout abdomen; hardness palpated in left lower quadrant that is consistent with back up of stool; restriction over bladder                 External Perineal Exam NA                             Internal Pelvic Floor NA   Patient confirms identification and approves PT to assess internal pelvic floor and treatment Yes next treatment session   PELVIC MMT: NA         TONE: NA   TODAY'S TREATMENT DATE:  11/03/22 Neuromuscular re-education: Pelvic floor contractions; appropriate way to incorporate into HEP with focus on gentle movement and relaxation Therapeutic activities: HEP review Balloon Breathing review  10/27/22 Neuromuscular re-education: Transversus abdominus training with multimodal cues for improved motor control and breath coordination Supine UE ball press 10 Sidelying UE ball  press 2 x 10 Exercises: Clam shells 2 x 10 bil Reverse clam shells 2 x 10 bil Bridge with hip adduction Seated hip adduction 15x Seated hip abduction 15x Seated hip internal rotation 15x  10/20/22 Manual: Bowel mobilization Exercises: Open books 10x bil Lower trunk rotation 2 x 10 Piriformis stretch 60 sec bil Therapeutic activities: Increasing water intake Self-massage Fiber supplement Practice toilet mechanics with balloon breathing     PATIENT EDUCATION:  Education details: see above Person  educated: Patient Education method: Programmer, multimedia, Demonstration, Tactile cues, Verbal cues, and Handouts Education comprehension: verbalized understanding   HOME EXERCISE PROGRAM: Written hand out   ASSESSMENT:   CLINICAL IMPRESSION: Patient has made excellent progress in pelvic floor physical therapy with completion of goals, most importantly daily bowel movement without straining. Due to this, he would like today to be his last session, but also requests to keep his case open until after he travels next week - he will call to determine if he is ready to be discharged. He inquired about returning to kegels; we discussed that this is appropriate, but only performing 10-20x each day and focusing on relaxation between contractions. We reviewed other HEP and balloon breathing with importance on not straining and how strengthening exercises are as important as mobility. He will continue to benefit from skilled PT intervention in order to improve complete evacuation and QOL.    OBJECTIVE IMPAIRMENTS: decreased activity tolerance, decreased coordination, decreased endurance, decreased mobility, decreased strength, increased fascial restrictions, increased muscle spasms, postural dysfunction, and pain.    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    REHAB POTENTIAL: Good   CLINICAL DECISION MAKING: Stable/uncomplicated   EVALUATION COMPLEXITY: Low     GOALS: Goals reviewed with patient? Yes   SHORT TERM GOALS: Target date: 10/07/22 - updated 11/03/22   Pt will be independent with HEP.    Baseline: Goal status: MET 11/03/22   2.  Pt will be independent with use of squatty potty, relaxed toileting mechanics, and improved bowel movement techniques in  order to increase ease of bowel movements and complete evacuation.    Baseline:  Goal status: MET 11/03/22   3.  Pt will increase all impaired lumbar A/ROM by 25% without pain.   Baseline:  Goal status:MET 11/03/22   4.  Pt will be able to teach back appropriate pressure management techniques with breathing in order to improve core facilitate and have appropriate pressure management in functional activities.  Baseline:  Goal status: MET 11/03/22       LONG TERM GOALS: Target date: 11/11/22 - updated 11/03/22   Pt will be independent with advanced HEP.    Baseline:  Goal status: MET 11/03/22   2.  Pt will demonstrate improvement in bil hip IR/extension in order to improve pelvic mobility.  Baseline:  Goal status: MET 11/03/22   3.  Pt will report no episodes of urinary or fecal incontinence in order to improve confidence in community activities and personal hygiene.    Baseline:  Goal status: MET 11/03/22   4.  Pt will report daily bowel movement, without straining, and complete emptying without any leaking afterwards.  Baseline:  Goal status: MET 11/03/22   5.  Pt will report no leaks with laughing, coughing, sneezing in order to improve comfort with interpersonal relationships and community activities.  Baseline:  Goal status:MET 11/03/22   6.  Pt will demonstrate normal pelvic floor muscle tone and A/ROM, able to achieve 4/5 strength with contractions and 10 sec endurance, in order to provide appropriate lumbopelvic support in functional activities.    Baseline:  Goal status:DISCHARGED     PLAN:   PT FREQUENCY: 1-2x/week   PT DURATION: 12 weeks   PLANNED INTERVENTIONS: Therapeutic exercises, Therapeutic activity, Neuromuscular re-education, Balance training, Gait training, Patient/Family education, Self Care, Joint mobilization, Dry Needling, Biofeedback, and Manual therapy   PLAN FOR NEXT SESSION: perform internal rectal assessment of pelvic floor; begin core  training; progress mobility exercises   Julio Alm, PT, DPT03/18/242:05 PM  PHYSICAL THERAPY DISCHARGE SUMMARY  Visits from Start of Care: 4  Current functional level related to goals / functional outcomes: Independent   Remaining deficits: See above   Education / Equipment: HEP   Patient agrees to discharge. Patient goals were partially met. Patient is being discharged due to not returning since the last visit.  Julio Alm, PT, DPT02/03/254:29 PM
